# Patient Record
Sex: Female | Born: 1955 | Race: White | Hispanic: No | Marital: Single | Smoking: Current every day smoker
Health system: Southern US, Community
[De-identification: ages and names within clinical notes are randomized; demographics above are authoritative.]

## PROBLEM LIST (undated history)

## (undated) DIAGNOSIS — J302 Other seasonal allergic rhinitis: Secondary | ICD-10-CM

## (undated) DIAGNOSIS — J449 Chronic obstructive pulmonary disease, unspecified: Secondary | ICD-10-CM

## (undated) DIAGNOSIS — J45909 Unspecified asthma, uncomplicated: Secondary | ICD-10-CM

## (undated) DIAGNOSIS — E669 Obesity, unspecified: Secondary | ICD-10-CM

## (undated) DIAGNOSIS — E119 Type 2 diabetes mellitus without complications: Secondary | ICD-10-CM

## (undated) DIAGNOSIS — Z87891 Personal history of nicotine dependence: Secondary | ICD-10-CM

## (undated) HISTORY — PX: ABDOMINAL HYSTERECTOMY: SHX81

## (undated) HISTORY — DX: Personal history of nicotine dependence: Z87.891

## (undated) HISTORY — DX: Unspecified asthma, uncomplicated: J45.909

## (undated) HISTORY — PX: LAPAROSCOPIC CHOLECYSTECTOMY: SUR755

## (undated) HISTORY — DX: Type 2 diabetes mellitus without complications: E11.9

## (undated) HISTORY — DX: Obesity, unspecified: E66.9

## (undated) HISTORY — DX: Chronic obstructive pulmonary disease, unspecified: J44.9

## (undated) HISTORY — DX: Other seasonal allergic rhinitis: J30.2

---

## 1998-03-11 ENCOUNTER — Emergency Department (HOSPITAL_COMMUNITY): Admission: EM | Admit: 1998-03-11 | Discharge: 1998-03-11 | Payer: Self-pay | Admitting: Emergency Medicine

## 1998-09-03 ENCOUNTER — Emergency Department (HOSPITAL_COMMUNITY): Admission: EM | Admit: 1998-09-03 | Discharge: 1998-09-03 | Payer: Self-pay | Admitting: Emergency Medicine

## 1998-09-03 ENCOUNTER — Encounter: Payer: Self-pay | Admitting: Emergency Medicine

## 2000-05-27 ENCOUNTER — Emergency Department (HOSPITAL_COMMUNITY): Admission: EM | Admit: 2000-05-27 | Discharge: 2000-05-27 | Payer: Self-pay | Admitting: *Deleted

## 2001-04-10 ENCOUNTER — Emergency Department (HOSPITAL_COMMUNITY): Admission: EM | Admit: 2001-04-10 | Discharge: 2001-04-11 | Payer: Self-pay | Admitting: Emergency Medicine

## 2001-04-11 ENCOUNTER — Encounter: Payer: Self-pay | Admitting: Emergency Medicine

## 2001-04-17 ENCOUNTER — Ambulatory Visit (HOSPITAL_COMMUNITY): Admission: RE | Admit: 2001-04-17 | Discharge: 2001-04-17 | Payer: Self-pay | Admitting: Internal Medicine

## 2001-04-17 ENCOUNTER — Encounter: Payer: Self-pay | Admitting: Internal Medicine

## 2001-04-23 ENCOUNTER — Encounter: Payer: Self-pay | Admitting: Gastroenterology

## 2001-04-23 ENCOUNTER — Encounter (INDEPENDENT_AMBULATORY_CARE_PROVIDER_SITE_OTHER): Payer: Self-pay | Admitting: Specialist

## 2001-04-24 ENCOUNTER — Inpatient Hospital Stay (HOSPITAL_COMMUNITY): Admission: RE | Admit: 2001-04-24 | Discharge: 2001-04-25 | Payer: Self-pay | Admitting: Gastroenterology

## 2001-05-29 ENCOUNTER — Emergency Department (HOSPITAL_COMMUNITY): Admission: EM | Admit: 2001-05-29 | Discharge: 2001-05-29 | Payer: Self-pay | Admitting: Emergency Medicine

## 2001-08-13 ENCOUNTER — Emergency Department (HOSPITAL_COMMUNITY): Admission: EM | Admit: 2001-08-13 | Discharge: 2001-08-13 | Payer: Self-pay | Admitting: Emergency Medicine

## 2001-11-07 ENCOUNTER — Emergency Department (HOSPITAL_COMMUNITY): Admission: EM | Admit: 2001-11-07 | Discharge: 2001-11-07 | Payer: Self-pay | Admitting: Emergency Medicine

## 2002-06-30 ENCOUNTER — Emergency Department (HOSPITAL_COMMUNITY): Admission: EM | Admit: 2002-06-30 | Discharge: 2002-07-01 | Payer: Self-pay | Admitting: Emergency Medicine

## 2004-02-03 ENCOUNTER — Emergency Department (HOSPITAL_COMMUNITY): Admission: EM | Admit: 2004-02-03 | Discharge: 2004-02-03 | Payer: Self-pay | Admitting: Emergency Medicine

## 2004-02-03 ENCOUNTER — Emergency Department (HOSPITAL_COMMUNITY): Admission: EM | Admit: 2004-02-03 | Discharge: 2004-02-03 | Payer: Self-pay | Admitting: Family Medicine

## 2004-02-14 ENCOUNTER — Encounter: Admission: RE | Admit: 2004-02-14 | Discharge: 2004-02-14 | Payer: Self-pay | Admitting: Obstetrics and Gynecology

## 2004-02-23 ENCOUNTER — Encounter: Admission: RE | Admit: 2004-02-23 | Discharge: 2004-02-23 | Payer: Self-pay | Admitting: Obstetrics and Gynecology

## 2004-03-08 ENCOUNTER — Encounter: Admission: RE | Admit: 2004-03-08 | Discharge: 2004-03-08 | Payer: Self-pay | Admitting: Obstetrics and Gynecology

## 2004-03-27 ENCOUNTER — Encounter: Admission: RE | Admit: 2004-03-27 | Discharge: 2004-03-27 | Payer: Self-pay | Admitting: Obstetrics and Gynecology

## 2004-03-29 ENCOUNTER — Ambulatory Visit (HOSPITAL_COMMUNITY): Admission: RE | Admit: 2004-03-29 | Discharge: 2004-03-29 | Payer: Self-pay | Admitting: *Deleted

## 2004-04-03 ENCOUNTER — Encounter: Admission: RE | Admit: 2004-04-03 | Discharge: 2004-04-03 | Payer: Self-pay | Admitting: Obstetrics and Gynecology

## 2004-04-16 ENCOUNTER — Encounter (INDEPENDENT_AMBULATORY_CARE_PROVIDER_SITE_OTHER): Payer: Self-pay | Admitting: Specialist

## 2004-04-16 ENCOUNTER — Ambulatory Visit (HOSPITAL_COMMUNITY): Admission: RE | Admit: 2004-04-16 | Discharge: 2004-04-16 | Payer: Self-pay | Admitting: Obstetrics and Gynecology

## 2005-03-25 ENCOUNTER — Ambulatory Visit: Payer: Self-pay | Admitting: Family Medicine

## 2005-03-26 ENCOUNTER — Ambulatory Visit: Payer: Self-pay | Admitting: *Deleted

## 2005-04-10 ENCOUNTER — Ambulatory Visit: Payer: Self-pay | Admitting: Internal Medicine

## 2005-06-21 ENCOUNTER — Ambulatory Visit: Payer: Self-pay | Admitting: Family Medicine

## 2005-11-20 ENCOUNTER — Ambulatory Visit: Payer: Self-pay | Admitting: Family Medicine

## 2005-11-20 DIAGNOSIS — J309 Allergic rhinitis, unspecified: Secondary | ICD-10-CM

## 2005-12-04 ENCOUNTER — Ambulatory Visit: Payer: Self-pay | Admitting: Family Medicine

## 2005-12-04 DIAGNOSIS — M722 Plantar fascial fibromatosis: Secondary | ICD-10-CM

## 2006-04-14 ENCOUNTER — Ambulatory Visit: Payer: Self-pay | Admitting: Family Medicine

## 2006-05-05 ENCOUNTER — Ambulatory Visit: Payer: Self-pay | Admitting: Family Medicine

## 2006-07-26 ENCOUNTER — Emergency Department (HOSPITAL_COMMUNITY): Admission: EM | Admit: 2006-07-26 | Discharge: 2006-07-26 | Payer: Self-pay | Admitting: Emergency Medicine

## 2006-07-29 ENCOUNTER — Ambulatory Visit: Payer: Self-pay | Admitting: Family Medicine

## 2007-01-26 ENCOUNTER — Ambulatory Visit: Payer: Self-pay | Admitting: Family Medicine

## 2007-02-02 ENCOUNTER — Ambulatory Visit (HOSPITAL_BASED_OUTPATIENT_CLINIC_OR_DEPARTMENT_OTHER): Admission: RE | Admit: 2007-02-02 | Discharge: 2007-02-02 | Payer: Self-pay

## 2007-05-25 DIAGNOSIS — N924 Excessive bleeding in the premenopausal period: Secondary | ICD-10-CM

## 2007-05-25 DIAGNOSIS — F329 Major depressive disorder, single episode, unspecified: Secondary | ICD-10-CM

## 2007-05-25 DIAGNOSIS — J45909 Unspecified asthma, uncomplicated: Secondary | ICD-10-CM | POA: Insufficient documentation

## 2007-06-03 ENCOUNTER — Encounter (INDEPENDENT_AMBULATORY_CARE_PROVIDER_SITE_OTHER): Payer: Self-pay | Admitting: *Deleted

## 2007-07-02 ENCOUNTER — Emergency Department (HOSPITAL_COMMUNITY): Admission: EM | Admit: 2007-07-02 | Discharge: 2007-07-02 | Payer: Self-pay | Admitting: Emergency Medicine

## 2007-10-20 ENCOUNTER — Ambulatory Visit: Payer: Self-pay | Admitting: Family Medicine

## 2007-10-20 DIAGNOSIS — R51 Headache: Secondary | ICD-10-CM

## 2007-10-20 DIAGNOSIS — R519 Headache, unspecified: Secondary | ICD-10-CM | POA: Insufficient documentation

## 2007-10-20 DIAGNOSIS — A630 Anogenital (venereal) warts: Secondary | ICD-10-CM | POA: Insufficient documentation

## 2007-10-20 LAB — CONVERTED CEMR LAB
ALT: 15 units/L (ref 0–35)
AST: 12 units/L (ref 0–37)
Albumin: 4.5 g/dL (ref 3.5–5.2)
Calcium: 9.1 mg/dL (ref 8.4–10.5)
Cholesterol: 158 mg/dL (ref 0–200)
Eosinophils Relative: 3 % (ref 0–5)
HDL: 63 mg/dL (ref >39)
Lymphocytes Relative: 30 % (ref 12–46)
MCHC: 32.7 g/dL (ref 30.0–36.0)
MCV: 89.8 fL (ref 78.0–100.0)
Monocytes Absolute: 0.7 10*3/uL (ref 0.1–1.0)
Monocytes Relative: 7 % (ref 3–12)
Neutro Abs: 6.2 10*3/uL (ref 1.7–7.7)
Neutrophils Relative %: 61 % (ref 43–77)
Platelets: 411 10*3/uL — ABNORMAL HIGH (ref 150–400)
Potassium: 4.1 meq/L (ref 3.5–5.3)
RBC: 4.6 M/uL (ref 3.87–5.11)
TSH: 0.916 microintl units/mL (ref 0.350–5.50)
Triglycerides: 112 mg/dL (ref <150)
VLDL: 22 mg/dL (ref 0–40)

## 2007-11-08 ENCOUNTER — Encounter (INDEPENDENT_AMBULATORY_CARE_PROVIDER_SITE_OTHER): Payer: Self-pay | Admitting: Family Medicine

## 2007-12-16 ENCOUNTER — Ambulatory Visit: Payer: Self-pay | Admitting: Obstetrics and Gynecology

## 2007-12-24 ENCOUNTER — Ambulatory Visit: Payer: Self-pay | Admitting: *Deleted

## 2008-04-16 ENCOUNTER — Emergency Department (HOSPITAL_COMMUNITY): Admission: EM | Admit: 2008-04-16 | Discharge: 2008-04-16 | Payer: Self-pay | Admitting: Emergency Medicine

## 2008-06-17 ENCOUNTER — Encounter (INDEPENDENT_AMBULATORY_CARE_PROVIDER_SITE_OTHER): Payer: Self-pay | Admitting: Family Medicine

## 2008-08-05 ENCOUNTER — Emergency Department (HOSPITAL_COMMUNITY): Admission: EM | Admit: 2008-08-05 | Discharge: 2008-08-05 | Payer: Self-pay | Admitting: Emergency Medicine

## 2008-09-01 ENCOUNTER — Ambulatory Visit: Payer: Self-pay | Admitting: Nurse Practitioner

## 2008-09-01 DIAGNOSIS — B86 Scabies: Secondary | ICD-10-CM | POA: Insufficient documentation

## 2008-10-26 ENCOUNTER — Ambulatory Visit: Payer: Self-pay | Admitting: Family Medicine

## 2008-12-26 ENCOUNTER — Ambulatory Visit: Payer: Self-pay | Admitting: Family Medicine

## 2008-12-26 DIAGNOSIS — F17200 Nicotine dependence, unspecified, uncomplicated: Secondary | ICD-10-CM

## 2008-12-27 ENCOUNTER — Encounter (INDEPENDENT_AMBULATORY_CARE_PROVIDER_SITE_OTHER): Payer: Self-pay | Admitting: Family Medicine

## 2009-02-01 ENCOUNTER — Telehealth (INDEPENDENT_AMBULATORY_CARE_PROVIDER_SITE_OTHER): Payer: Self-pay | Admitting: Family Medicine

## 2009-06-08 ENCOUNTER — Emergency Department (HOSPITAL_COMMUNITY): Admission: EM | Admit: 2009-06-08 | Discharge: 2009-06-08 | Payer: Self-pay | Admitting: Family Medicine

## 2009-07-13 ENCOUNTER — Ambulatory Visit: Payer: Self-pay | Admitting: Physician Assistant

## 2009-07-13 DIAGNOSIS — M25519 Pain in unspecified shoulder: Secondary | ICD-10-CM

## 2009-07-14 ENCOUNTER — Encounter: Payer: Self-pay | Admitting: Physician Assistant

## 2009-07-22 ENCOUNTER — Encounter: Payer: Self-pay | Admitting: Physician Assistant

## 2009-08-03 ENCOUNTER — Telehealth: Payer: Self-pay | Admitting: Physician Assistant

## 2009-08-07 ENCOUNTER — Telehealth: Payer: Self-pay | Admitting: Physician Assistant

## 2009-08-27 ENCOUNTER — Encounter: Payer: Self-pay | Admitting: Physician Assistant

## 2009-09-13 ENCOUNTER — Encounter: Payer: Self-pay | Admitting: Physician Assistant

## 2009-09-21 ENCOUNTER — Encounter: Admission: RE | Admit: 2009-09-21 | Discharge: 2009-12-20 | Payer: Self-pay | Admitting: Orthopedic Surgery

## 2009-10-02 ENCOUNTER — Telehealth: Payer: Self-pay | Admitting: Physician Assistant

## 2009-10-06 ENCOUNTER — Telehealth: Payer: Self-pay | Admitting: Physician Assistant

## 2009-10-13 ENCOUNTER — Telehealth: Payer: Self-pay | Admitting: Physician Assistant

## 2009-11-01 ENCOUNTER — Telehealth: Payer: Self-pay | Admitting: Physician Assistant

## 2009-11-09 ENCOUNTER — Ambulatory Visit: Payer: Self-pay | Admitting: Physician Assistant

## 2009-12-06 ENCOUNTER — Ambulatory Visit: Payer: Self-pay | Admitting: Obstetrics and Gynecology

## 2009-12-21 ENCOUNTER — Encounter: Payer: Self-pay | Admitting: Physician Assistant

## 2010-07-02 ENCOUNTER — Encounter: Payer: Self-pay | Admitting: Physician Assistant

## 2010-10-07 ENCOUNTER — Encounter: Payer: Self-pay | Admitting: Family Medicine

## 2010-10-16 NOTE — Letter (Signed)
Summary: MAILED REQUESTED RECORDS TO [facility] SERVICES [facility]  MAILED REQUESTED RECORDS TO [facility]   Imported By: Arta Bruce 07/02/2010 11:50:27  _____________________________________________________________________  External Attachment:    Type:   Image     Comment:   External Document

## 2010-10-16 NOTE — Letter (Signed)
Summary: MAILED REQUESTED RECORDS TO [facility]  MAILED REQUESTED RECORDS TO [facility]   Imported By: Arta Bruce 12/21/2009 15:43:19  _____________________________________________________________________  External Attachment:    Type:   Image     Comment:   External Document

## 2010-10-16 NOTE — Letter (Signed)
Summary: REFERRAL/ORTHOPEDIC  REFERRAL/ORTHOPEDIC   Imported By: Arta Bruce 09/19/2009 14:28:05  _____________________________________________________________________  External Attachment:    Type:   Image     Comment:   External Document

## 2010-10-16 NOTE — Progress Notes (Signed)
Summary: SINGULAIR NEEDS PRIOR APPROVAL   Phone Note Call from Patient Call back at Home Phone [phone]   Reason for Call: Refill Medication Summary of Call: Alisha Roth PT. MS Sorto SAYS THAT SHE IS STILL WAITING ON HER SINGULAIR. [facility] AND THEY TOLD HER THAT IT NEEDS PRIOR APPROVAL AND WE NEED TO CALL THE PHARMACY. Initial call taken by: Leodis Rains,  November 01, 2009 9:20 AM  Follow-up for Phone Call        spoke with pharmacy and they said the med is accepted with insurance now..... inform pt and she is aware Follow-up by: Armenia Shannon,  November 01, 2009 12:59 PM

## 2010-10-16 NOTE — Assessment & Plan Note (Signed)
Summary: BOIL ON BUTTOCK/CHK ASTHMA////KT   Vital Signs:  Patient profile:   55 year old female Height:      60 inches Weight:      171 pounds BMI:     33.52 O2 Sat:      94 % on Room air Temp:     98.2 degrees F oral Pulse rate:   92 / minute Pulse rhythm:   regular Resp:     18 per minute BP sitting:   125 / 81  (left arm) Cuff size:   large  Vitals Entered By: Armenia Shannon (November 09, 2009 2:03 PM)  O2 Flow:  Room air CC: pt has a boil on her bottom and would like for you to look at  at.... pt wants her asthma checked and she would like a flu shot...  Does patient need assistance? Functional Status Self care Ambulation Normal Comments PF.   1.   130     2.  100     3.   140     Primary Care Provider:  Tereso Newcomer PA-C  CC:  pt has a boil on her bottom and would like for you to look at  at.... pt wants her asthma checked and she would like a flu shot....  History of Present Illness: Here for lesion on buttocks present about a month.  No pain .  No fever.  No chills.  No drainage.  Asthma/COPD:  Continues to smoke.  Symptoms overall stable.  She uses proventil more when she gets a cold.  Still using advair, singulair.  Depression:  Goes to [facility] once a month.  No suicidal ideations.  Mood seems stable.  Left shoulder:  Had RTC surgery on left in January 2011 with Dr. Madelon Lips.  Feeling much better.   Asthma History    Initial Asthma Severity Rating:    Age range: 12+ years    Symptoms: >2 days/week; not daily    Nighttime Awakenings: 3-4/month    Interferes w/ normal activity: no limitations    SABA use (not for EIB): daily    Asthma Severity Assessment: Moderate Persistent   Habits & Providers  Alcohol-Tobacco-Diet     Tobacco Status: current     Tobacco Counseling: to quit use of tobacco products     Cigarette Packs/Day: 0.5  Current Medications (verified): 1)  Advair Diskus 250-50 Mcg/dose  Misc (Fluticasone-Salmeterol) .... One Inhalation Two Times A  Day  (Pharmacy Note That Prior Auth Has Been Received X 2 Years From Today; Please Notify Patient When Rx Ready) 2)  Albuterol Sulfate (2.5 Mg/51ml) 0.083%  Nebu (Albuterol Sulfate) .... Use One Ampule Per Svn Every 4-6 Hours As Needed 3)  Ventolin Hfa 108 (90 Base) Mcg/act  Aers (Albuterol Sulfate) .... 2 Puffs Every Four Hours As Needed For Shortness of Breath 4)  Elimite 5 % Crea (Permethrin) .... Massage Cream Into Entire Body.  Let Sit For 8-12 Hours Then Rinse 5)  Triamcinolone Acetonide 0.1 % Oint (Triamcinolone Acetonide) .... Apply To Affected Area Two Times A Day As Needed For Itching 6)  Clonazepam 1 Mg Tabs (Clonazepam) .... Take 1/2 To 1 Tablet By Mouth Every 12 Hours 7)  Effexor Xr 150 Mg Xr24h-Cap (Venlafaxine Hcl) .... Take 1 Tablet By Mouth Once A Day 8)  Prednisone 20 Mg Tabs (Prednisone) .... Take 2 Tablets By Mouth  Every Morning X 5 Days 9)  Singulair 10 Mg Tabs (Montelukast Sodium) .Marland Kitchen.. 1 By Mouth Daily For Asthma 10)  Ibuprofen 800 Mg Tabs (Ibuprofen) .... Take 1 Tablet By Mouth Three Times A Day As Needed For Pain  Allergies (verified): No Known Drug Allergies  Social History: Packs/Day:  0.5  Physical Exam  General:  alert, well-developed, and well-nourished.   Head:  normocephalic and atraumatic.   Lungs:  normal breath sounds, no crackles, and no wheezes.   Heart:  normal rate and regular rhythm.   Rectal:  1 cm lesion with scalloping somewhat c/w condyloma at lower buttocks post to vaginal area Neurologic:  alert & oriented X3 and cranial nerves II-XII intact.   Psych:  normally interactive.     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 Use one ampule per svn every 4-6 hours as needed    Prednisone 20 Mg Tabs (Prednisone) .Marland Kitchen... Take 2 tablets by mouth  every morning x 5 days Her updated medication list for this problem includes:    Advair Diskus 250-50 Mcg/dose Misc (Fluticasone-salmeterol) ..... One inhalation two times a day  (pharmacy note that prior Berkley Harvey has been received x 2 years from today; please notify patient when rx ready)    Ventolin Hfa 108 (90 Base) Mcg/act Aers (Albuterol sulfate) .Marland Kitchen... 2 puffs every four hours as needed for shortness of breath    Singulair 10 Mg Tabs (Montelukast sodium) .Marland Kitchen... 1 by mouth daily for asthma  Problem # 3:  DEPRESSION (ICD-311) follows at the [facility]  The following medications were removed from the medication list:    Clonazepam 1 Mg Tabs (Clonazepam) .Marland Kitchen... Take 1/2 to 1 tablet by mouth every 12 hours    Effexor Xr 150 Mg Xr24h-cap (Venlafaxine hcl) .Marland Kitchen... Take 1 tablet by mouth once a day Her updated medication list for this problem includes:    Sertraline Hcl 100 Mg Tabs (Sertraline hcl) .Marland Kitchen... 2 by mouth in the morning  Problem # 4:  Preventive Health Care (ICD-V70.0) no pap or mammo in long time will schedule  Complete Medication List: 1)  Advair Diskus 250-50 Mcg/dose Misc (Fluticasone-salmeterol) .... One inhalation two times a day  (pharmacy note that prior Berkley Harvey has been received x 2 years from today; please notify patient when rx ready) 2)  Ventolin Hfa 108 (90 Base) Mcg/act Aers (Albuterol sulfate) .... 2 puffs every four hours as needed for shortness of breath 3)  Singulair 10 Mg Tabs (Montelukast sodium) .Marland Kitchen.. 1 by mouth daily for asthma 4)  Ibuprofen 800 Mg Tabs (Ibuprofen) .... Take 1 tablet by mouth three times a day as needed for pain 5)  Sertraline Hcl 100 Mg Tabs (Sertraline hcl) .... 2 by mouth in the morning 6)  Seroquel  100 Mg Tabs (Quetiapine fumarate) .... Take 1 tab by mouth at bedtime ([facility]) 7)  Oxycodone-acetaminophen 5-325 Mg Tabs (Oxycodone-acetaminophen) .Marland Kitchen.. 1 by mouth every 6-8 hours as needed for pain  Other Orders: Flu Vaccine 51yrs + [phone]) Admin 1st Vaccine (40347) Admin 1st Vaccine [facility]) [phone]) Pneumococcal Vaccine (38756) Admin of Any Addtl Vaccine (43329) Admin of Any Addtl Vaccine (State) [phone])  Patient Instructions: 1)  Please schedule a follow-up appointment in 6 weeks with Jashira Cotugno for CPP. 2)      Pneumovax Vaccine    Vaccine Type: Pneumovax    Site: left deltoid    Mfr: Merck    Dose: 0.5 ml    Route: IM    Given by: Armenia Shannon    Exp. Date: 10/13/2010    Lot #: [id number]    VIS given: 04/13/96 version given November 09, 2009.  Influenza Vaccine    Vaccine Type: Fluvax 3+    Site: right deltoid    Mfr: Sanofi Pasteur    Dose: 0.5 ml    Route: IM    Given by: Armenia Shannon    Exp. Date: 03/15/2010    Lot #: [id number]    VIS given: 04/09/07 version given November 09, 2009.  Flu Vaccine Consent Questions    Do you have a history of severe allergic reactions to this vaccine? no    Any prior history of allergic reactions to egg and/or gelatin? no    Do you have a sensitivity to the preservative Thimersol? no    Do you have a past history of Guillan-Barre Syndrome? no    Do you currently have an acute febrile illness? no    Have you ever had a severe reaction to latex? no    Vaccine information given and explained to patient? yes    Are you currently pregnant? no

## 2010-10-16 NOTE — Progress Notes (Signed)
Summary: Refills Request   Phone Note Call from Patient Call back at Home Phone [phone]   Summary of Call: [facility] [phone]) but they suggested her to call here and she needs more refills from [facility].  If is possible, can the provider send the refill request before 1 pm because she has transportation issues if so  can the clinic call directly to her daughter Olene Craven [phone]. [place]  Initial call taken by: Manon Hilding,  October 13, 2009 8:55 AM  Follow-up for Phone Call        last rx 04/2009 and last visit 10/10-will forward to provider for review Follow-up by: Mikey College CMA,  October 13, 2009 3:40 PM  Additional Follow-up for Phone Call Additional follow up Details #1::        pt is aware med is at pharmacy  Additional Follow-up by: Armenia Shannon,  October 17, 2009 12:10 PM    Prescriptions: SINGULAIR 10 MG TABS (MONTELUKAST SODIUM) 1 by mouth daily for asthma  #30 x 5   Entered and Authorized by:   Tereso Newcomer PA-C   Signed by:   Tereso Newcomer PA-C on 10/16/2009   Method used:   [facility]  [address] [id number]       [address]       [place], [place]  [address]       Ph: [phone]       Fax: [phone]   RxID:   [id number]

## 2010-10-16 NOTE — Letter (Signed)
Summary: [facility]   Imported By: Arta Bruce 10/30/2009 15:33:11  _____________________________________________________________________  External Attachment:    Type:   Image     Comment:   External Document

## 2010-10-16 NOTE — Progress Notes (Signed)
   Phone Note Call from Patient Call back at [facility] Phone [phone]   Summary of Call: A week ago the pt had a shoulder surgery and she needs refills from  two her regular medications advair and ventollin.  ([facility] [phone]) [place] Initial call taken by: Manon Hilding,  October 02, 2009 2:28 PM  Follow-up for Phone Call        forward to provider Follow-up by: Armenia Shannon,  October 02, 2009 2:56 PM    Prescriptions: ADVAIR DISKUS 250-50 MCG/DOSE  MISC (FLUTICASONE-SALMETEROL) one inhalation two times a day  Rinse and spit after use  #1 x 5   Entered and Authorized by:   Tereso Newcomer PA-C   Signed by:   Tereso Newcomer PA-C on 10/02/2009   Method used:   [facility]        [facility]  [address] [id number]       [address]       [place], [place]  [address]       Ph: [phone]       Fax: [phone]   RxID:   [id number] VENTOLIN HFA 108 (90 BASE) MCG/ACT  AERS (ALBUTEROL SULFATE) 2 puffs every four hours as needed for shortness of breath  #1 x 5   Entered and Authorized by:   Tereso Newcomer PA-C   Signed by:   Tereso Newcomer PA-C on 10/02/2009   Method used:   [facility]        [facility]  [address] [id number]       [address]       [place], [place]  [address]       Ph: [phone]       Fax: [phone]   RxID:   [id number]

## 2010-10-16 NOTE — Progress Notes (Signed)
Summary: ADVAIR NEEDS PRIOR APPROVAL  Medications Added ADVAIR DISKUS 250-50 MCG/DOSE  MISC (FLUTICASONE-SALMETEROL) one inhalation two times a day  (pharmacy note that prior Berkley Harvey has been received x 2 years from today; please notify patient when Rx ready)       Phone Note Call from Patient Call back at [facility] Phone [phone]   Summary of Call: Francisco Eyerly PT. MS Bellin CALLED AN SAYS THAT SHE COULDN'T GET HER ADVAIR INHALER BECAUSE WE NEED TO CALL MEDICAID FOR PRIOR APPROVAL. [facility] [address] Initial call taken by: Leodis Rains,  October 06, 2009 10:14 AM  Follow-up for Phone Call        forward to provider Follow-up by: Armenia Shannon,  October 06, 2009 3:36 PM    New/Updated Medications: ADVAIR DISKUS 250-50 MCG/DOSE  MISC (FLUTICASONE-SALMETEROL) one inhalation two times a day  (pharmacy note that prior Berkley Harvey has been received x 2 years from today; please notify patient when Rx ready) Prescriptions: ADVAIR DISKUS 250-50 MCG/DOSE  MISC (FLUTICASONE-SALMETEROL) one inhalation two times a day  (pharmacy note that prior Berkley Harvey has been received x 2 years from today; please notify patient when Rx ready)  #1 x 11   Entered and Authorized by:   Tereso Newcomer PA-C   Signed by:   Tereso Newcomer PA-C on 10/06/2009   Method used:   [facility]        [facility]  [address] [id number]       [address]       [place], [place]  [address]       Ph: [phone]       Fax: [phone]   RxID:   [id number]

## 2010-10-16 NOTE — Letter (Signed)
Summary: REFERRAL//ORTHOPEDIC//APPT DATE & TIME  REFERRAL//ORTHOPEDIC//APPT DATE & TIME   Imported By: Arta Bruce 10/02/2009 16:34:59  _____________________________________________________________________  External Attachment:    Type:   Image     Comment:   External Document

## 2011-02-01 NOTE — Op Note (Signed)
Alisha Roth, Alisha Roth                         ACCOUNT NO.:  [id number]   MEDICAL RECORD NO.:  [id number]                   PATIENT TYPE:  AMB   LOCATION:  SDC                                  FACILITY:  [facility]   PHYSICIAN:  Phil D. Okey Dupre, M.D.                  DATE OF BIRTH:  10/08/1955   DATE OF PROCEDURE:  04/16/2004  DATE OF DISCHARGE:                                 OPERATIVE REPORT   PREOPERATIVE DIAGNOSIS:  Persistent condyloma acuminata of vulvar, perineal  and anal areas.   POSTOPERATIVE DIAGNOSIS:  Persistent condyloma acuminata of vulvar, perineal  and anal areas.   OPERATION PERFORMED:  Excision and fulguration of condyloma acuminata.   SURGEON:  Javier Glazier. Okey Dupre, M.D.   ANESTHESIA:  General.   P.O. CONDITION:  Satisfactory.   SPECIMENS:  Various condylomata acuminata.   OPERATIVE FINDINGS:  On examining the patient's perineum, the lower portion  of the vulva, the perineum and the perianal and anal area had multiple small  condylomata acuminata that obviously did not resolve with conservative  treatment.   DESCRIPTION OF PROCEDURE:  The ball cautery set at 25 coag and a loop  electrode set at 25 blend was used to remove or fulgurate the condylomata.  This was done and in the nonhairy areas, the fulguration was taken down to  the first plane in the hairy areas, down to the second.  One area where  there was a slightly enlarged condylomata we had to remove, a 3-0 plain  suture was used to approximate the skin edges after removal.  That was on  the right side of the perineum in an area that measured 2 x 1 cm.  Otherwise, no suturing was done.  Prior to fulguration, majority of the  condylomata with any substance, were raised with a 1% Xylocaine 1:200,000  epinephrine over the first subepidermal area with a 24 needle.  Silver  Sulfadine was then placed over the entire surgical area and the patient  discharged to be seen in the office in one week.                          Phil D. Okey Dupre, M.D.    PDR/MEDQ  D:  04/16/2004  T:  04/16/2004  Job:  [id number]

## 2011-02-01 NOTE — Op Note (Signed)
Alisha Roth, Alisha Roth             ACCOUNT NO.:  [id number]   MEDICAL RECORD NO.:  [id number]          PATIENT TYPE:  AMB   LOCATION:  DSC                          FACILITY:  [facility]   PHYSICIAN:  Alvan Dame, D.P.M. DATE OF BIRTH:  1956/04/07   DATE OF PROCEDURE:  02/02/2007  DATE OF DISCHARGE:                               OPERATIVE REPORT   PREOPERATIVE DIAGNOSIS:  Plantar fasciitis, right foot.   POSTOPERATIVE DIAGNOSIS:  Plantar fasciitis, right foot.   OPERATIVE PROCEDURE:  Endoscopic plantar fasciotomy (EPF), right foot.   SURGEON:  Alvan Dame, DPM.   ANESTHESIA:  MAC and local anesthetic administered, a total of 20 mL,  50:50 mixture of 2% Xylocaine and 0.5% Marcaine plain in a regional and  ankle block fashion.   HEMOSTASIS:  Use of right ankle tourniquet at 250 mmHg.   INDICATIONS FOR SURGERY:  The patient has had a greater than 4-month  history of pain nonresponsive to conservative care, including anti-  inflammatories, injections and mobilization, orthoses, change of shoe  gear, stretching, ice and change of activities.  The patient at this  time continues to have pain, and based upon her clinical symptomatology  and failure to respond to conservative care, surgery is proposed.  No  contraindications noted.   FINDINGS AND PROCEDURE:  The patient was brought into the OR and placed  on the table in a supine position.  IV sedation was established.  Local  anesthetic was administered, and the foot was prepped and draped in the  usual aseptic manner.  The foot was exsanguinated with an Esmarch wrap,  and the ankle tourniquet inflated to 250 mmHg.  The following procedure  was then carried out.   EPF, right foot:  Attention was directed to the medial aspect of the  right heel, where approximately a 1-cm stab incision was made in a  vertical fashion approximately 5 cm from posterior to 2 cm from the  inferior surface of the heel.  This corresponded to be adjacent  to the  medial band of the plantar fascia.  A stab incision was made.  A Freer  elevator was introduced and a plane was made beneath the inferior  surface of the plantar fascia.  The Glorious Peach was removed and replaced with  a trocar and cannula, which were passed from medial to lateral beneath  the surface of the fascia, with the slot in the cannula up adjacent to  the surface of the inferior plantar fascia.  A small stab incision was  made on the lateral surface of the heel along the exit of the trocar and  cannula.  The trocar was removed, the cannula left in place.  At this  time, the camera was introduced medially and a Glorious Peach was introduced  laterally to identify the inferior surface of the fascia, which was  noted to be intact.  At this time, utilizing a triangle blade, the  medial 1/2 of the plantar fascia was transected along the gap.  Once  opened, the gap opened at least 4 mm, or the width of the slot in the  cannula.  Beneath  the fascia, the pink muscle belly was identified, and  noting an adequate resection or opening of the plantar fascia medial  band, at this time, the camera was removed from the medial opening of  the cannula and placed from lateral to inspect the most medial aspects  of the fascia.  The triangle blade was introduced and some additional  bands medially were released.  The cannula was then turned inferiorly to  inspect and no additional bands of the fascia were noted to be inferior  to the cannula.  At this time, the camera and instruments were removed.  The site was lavaged with copious amounts of sterile antibiotic solution  and cleared of all soft tissue debris.  Closure of each of the stab  sites was made utilizing a single 4-0 nylon suture.  The site was  infiltrated with 1 mL of dexamethasone phosphate, or 10 mg of  dexamethasone phosphate.  A Betadine saline-soaked sponge and a dry  sterile compressive dressing were applied to the right foot in the heel   and ankle area.  The ankle tourniquet was deflated with immediate return  of perfusion.   The patient was returned from the OR to recovery in satisfactory  condition, and discharged following postoperative instructions,  prescriptions for pain and antibiotic medication, and an appointment for  followup office visit having been made.           ______________________________  Alvan Dame, D.P.M.     RS/MEDQ  D:  02/03/2007  T:  02/03/2007  Job:  [id number]

## 2011-02-01 NOTE — Op Note (Signed)
[facility]  Patient:    Alisha Roth, Alisha Roth                         MRN: [id number] Proc. Date: 04/24/01 Adm. Date:  [id number] Attending:  Tempie Donning CC:         Barbette Hair. Arlyce Dice, M.D. [facility]  Corwin Levins, M.D. [facility]   Operative Report  OPERATIVE PROCEDURE:  Laparoscopic cholecystectomy.  SURGEON:  Gita Kudo, M.D.  ASSISTANTRiley Lam A. Magnus Ivan, M.D.  ANESTHESIA:  General endotracheal.  PREOPERATIVE DIAGNOSIS:  Cholecystitis.  POSTOPERATIVE DIAGNOSIS:  Cholecystitis.  CLINICAL SUMMARY:  A 55 year old female with bouts of abdominal pain and gallbladder ultrasound showing stones.  Her biliary ductal system was dilated, and she underwent ERCP by Dr. Arlyce Dice on August 8.  There was no evidence of any biliary obstruction.  Her liver function studies and amylase were within normal limits.  OPERATIVE FINDINGS:  Her gallbladder was thin-walled and had some filmy adhesions around it.  The common duct looked quite large.  I did not repeat any contrast studies since yesterdays films were of good quality.  OPERATIVE PROCEDURE:  Under satisfactory general endotracheal anesthesia, having received 1.0 gram Ancef preop, the patients abdomen was prepped and draped in a standard fashion.  A transverse incision made above the umbilicus and the midline opened into the peritoneum.  This was controlled with a figure-of-eight 0 Vicryl suture.  Hasson port inserted and secured.  CO2 pneumoperitoneum established and camera placed.  Under direct vision, through Marcaine-infiltrated skin incisions, two #5 ports placed laterally and a second #10 port medially.  Operating through the medial port and with graspers in the lateral port giving excellent exposure, I took the  adhesions down from the gallbladder with moderate use of cautery to avoid any burning.  Then I carefully identified the cystic duct gallbladder junction.  On ERCP, the cystic duct looked quite  long.  Because the common duct was so large and closed, I stayed close to the gallbladder.  I circumferentially dissected the cystic duct and when we were certain of the anatomy, it was controlled with multiple metal clips and divided between the distal two.  Likewise, the cystic artery was dissected and divided.  Then, the gallbladder was removed from below upward using the coagulating spatula for both hemostasis and dissection. Just before amputating, the liver bed was checked for hemostasis, lavaged with saline, cauterized, and then suctioned dry.  The gallbladder was then amputated and the camera moved to the upper port.  Through the umbilical port, a large grasper placed and used to extract the gallbladder intact.  Because of the large size of the stones, the gallbladder was opened externally and the stones removed - at least two large ones and possibly others left in.  At that point, the gallbladder came through the opening easily.  Then, the operative site was again inspected, lavaged, and suctioned dry.  The ports were removed under vision and CO2 released.  The midline closed with a previous figure-of-eight as well as a second interrupted 0 Vicryl suture.  The subcutaneous tissues were then closed with 4-0 Vicryl and Steri-Strips used to approximate the skin.  Dressings were applied, and the patient went to the recovery room from the operating room in good condition without complications. DD:  04/24/01 TD:  04/25/01 Job: [id number]

## 2015-08-25 ENCOUNTER — Encounter: Payer: Self-pay | Admitting: Gastroenterology

## 2018-03-27 LAB — PULMONARY FUNCTION TEST

## 2018-05-29 LAB — PULMONARY FUNCTION TEST

## 2018-08-31 ENCOUNTER — Ambulatory Visit (INDEPENDENT_AMBULATORY_CARE_PROVIDER_SITE_OTHER): Payer: Medicare Other | Admitting: Pulmonary Disease

## 2018-08-31 ENCOUNTER — Encounter: Payer: Self-pay | Admitting: Pulmonary Disease

## 2018-08-31 VITALS — BP 100/62 | HR 107 | Ht 61.0 in | Wt 209.6 lb

## 2018-08-31 DIAGNOSIS — G4733 Obstructive sleep apnea (adult) (pediatric): Secondary | ICD-10-CM

## 2018-08-31 DIAGNOSIS — Z9989 Dependence on other enabling machines and devices: Secondary | ICD-10-CM

## 2018-08-31 DIAGNOSIS — J449 Chronic obstructive pulmonary disease, unspecified: Secondary | ICD-10-CM

## 2018-08-31 NOTE — Patient Instructions (Addendum)
Thank you for visiting Dr. Tonia BroomsIcard at [facility]. Today we recommend the following:  Orders Placed This Encounter  Procedures  . Pulmonary function test   Please send records from prior pulmonologist. Please send records from PCP  Continue current inhaler regimen, Symbicort, Spiriva and albuterol as needed  Continue azithromycin MWF as directed from prior pulmonologist in [place]   Return in about 8 weeks (around 10/26/2018). Follow up with NP to review PFTs.

## 2018-08-31 NOTE — Progress Notes (Signed)
Synopsis: Self-referral in December 2019 for history of COPD asthma.  Subjective:   PATIENT ID: Alisha Roth GENDER: female DOB: [birth year], MRN: [id number]  Chief Complaint  Patient presents with  . Consult    States she is here Roth to SOB with exertion. Denies chest pain but chest remains tight all the time. States she has a intermittent cough. Currently on Amoxicillin for Bronchitis.     PMH of Asthma diagnosed as child, Former smoker, quit 2016, teenager to 2016, 50 + years, 0.5ppd unless stressed smoked she smoked more. Never had lung cancer screening imagining. Had PFTs within this past year. No current respiratory symptoms. She gets around ok on her own. Walking she sometimes gets out of breath. She was told she had COPD but not sure how bad.  Patient does describe a history of a bad exacerbation this past year.  She was seen by her prior pulmonologist who placed her on azithromycin Monday Wednesday and Friday.  Unfortunately, the patient has zero recollection of any of her prior medical history.  For example she her documentation here at [facility] suggest that she had a laparoscopic cholecystectomy and she has no memory of ever this happening. And most of this has to be probed out of her with questioning.  She is very confused on most of her medications but what I believe she is on currently is Symbicort, Spiriva and albuterol nebulizer as needed for shortness of breath and wheezing.  She also is currently taking an antibiotic Monday Wednesday and Friday which I presume is azithromycin.  She brought none of her medications to the office today and she brought no prior medical documentation either.  Overall we will need to request documentation from her prior pulmonologist as well as her current PCP.  For her current respiratory symptoms.  She does not have any except for a daily cough.  Is nonproductive, no hemoptysis. No wheezing.   Past Medical History:  Diagnosis Date  . Asthma   . COPD  (chronic obstructive pulmonary disease) (HCC)   . DM (diabetes mellitus) (HCC)   . Former smoker Quit 2016  . Obesity      Family History  Problem Relation Age of Onset  . Diabetes Mother   . Alcohol abuse Father   . Diabetes Sister   . Alcohol abuse Brother      Past Surgical History:  Procedure Laterality Date  . LAPAROSCOPIC CHOLECYSTECTOMY      Social History   Socioeconomic History  . Marital status: Single    Spouse name: Not on file  . Number of children: Not on file  . Years of education: Not on file  . Highest education level: Not on file  Occupational History  . Not on file  Social Needs  . Financial resource strain: Not on file  . Food insecurity:    Worry: Not on file    Inability: Not on file  . Transportation needs:    Medical: Not on file    Non-medical: Not on file  Tobacco Use  . Smoking status: Former Games developer  . Smokeless tobacco: Never Used  . Tobacco comment: quit 2016  Substance and Sexual Activity  . Alcohol use: Never    Frequency: Never  . Drug use: Never  . Sexual activity: Not on file  Lifestyle  . Physical activity:    Days per week: Not on file    Minutes per session: Not on file  . Stress: Not on file  Relationships  .  Social connections:    Talks on phone: Not on file    Gets together: Not on file    Attends religious service: Not on file    Active member of club or organization: Not on file    Attends meetings of clubs or organizations: Not on file    Relationship status: Not on file  . Intimate partner violence:    Fear of current or ex partner: Not on file    Emotionally abused: Not on file    Physically abused: Not on file    Forced sexual activity: Not on file  Other Topics Concern  . Not on file  Social History Narrative  . Not on file     Not on File   Outpatient Medications Prior to Visit  Medication Sig Dispense Refill  . albuterol (PROVENTIL) (2.5 MG/3ML) 0.083% nebulizer solution Inhale into the lungs.     . budesonide-formoterol (SYMBICORT) 160-4.5 MCG/ACT inhaler Inhale into the lungs.    . cetirizine (ZYRTEC) 10 MG tablet Take by mouth.    . dapagliflozin propanediol (FARXIGA) 10 MG TABS tablet Take by mouth.    . Insulin Degludec (TRESIBA) 100 UNIT/ML SOLN Inject 70 Units into the skin at bedtime.    . liraglutide (VICTOZA) 18 MG/3ML SOPN Inject into the skin.    Marland Kitchen lisinopril (PRINIVIL,ZESTRIL) 10 MG tablet Take by mouth.    . meloxicam (MOBIC) 15 MG tablet Take by mouth.    . montelukast (SINGULAIR) 10 MG tablet Take by mouth.    Marland Kitchen omeprazole (PRILOSEC) 40 MG capsule Take by mouth.    . QUEtiapine (SEROQUEL XR) 300 MG 24 hr tablet Take 600 mg by mouth at bedtime.      No facility-administered medications prior to visit.     This medication as listed above is likely inaccurate.  I have no medication list to go by.  And the patient is completely unreliable based upon her history taking.  Review of Systems  Constitutional: Negative for chills, fever, malaise/fatigue and weight loss.  HENT: Negative for hearing loss, sore throat and tinnitus.   Eyes: Negative for blurred vision and double vision.  Respiratory: Positive for cough. Negative for hemoptysis, sputum production, shortness of breath, wheezing and stridor.   Cardiovascular: Negative for chest pain, palpitations, orthopnea, leg swelling and PND.  Gastrointestinal: Negative for abdominal pain, constipation, diarrhea, heartburn, nausea and vomiting.  Genitourinary: Negative for dysuria, hematuria and urgency.  Musculoskeletal: Negative for joint pain and myalgias.  Skin: Negative for itching and rash.  Neurological: Negative for dizziness, tingling, weakness and headaches.  Endo/Heme/Allergies: Negative for environmental allergies. Does not bruise/bleed easily.  Psychiatric/Behavioral: Negative for depression. The patient is not nervous/anxious and does not have insomnia.   All other systems reviewed and are  negative.    Objective:  Physical Exam Vitals signs reviewed.  Constitutional:      General: She is not in acute distress.    Appearance: She is well-developed.  HENT:     Head: Normocephalic and atraumatic.  Eyes:     General: No scleral icterus.    Conjunctiva/sclera: Conjunctivae normal.     Pupils: Pupils are equal, round, and reactive to light.  Neck:     Musculoskeletal: Neck supple.     Vascular: No JVD.     Trachea: No tracheal deviation.  Cardiovascular:     Rate and Rhythm: Normal rate and regular rhythm.     Heart sounds: Normal heart sounds. No murmur.  Pulmonary:  Effort: Pulmonary effort is normal. No tachypnea, accessory muscle usage or respiratory distress.     Breath sounds: Normal breath sounds. No stridor. No wheezing, rhonchi or rales.  Abdominal:     General: Bowel sounds are normal. There is no distension.     Palpations: Abdomen is soft.     Tenderness: There is no abdominal tenderness.     Comments: Obese pannus Small horizontal well-healed scar/incision.  Does have history documented of prior laparoscopic cholecystectomy.  Musculoskeletal:        General: No tenderness.  Lymphadenopathy:     Cervical: No cervical adenopathy.  Skin:    General: Skin is warm and dry.     Capillary Refill: Capillary refill takes less than 2 seconds.     Findings: No rash.  Neurological:     Mental Status: She is alert and oriented to person, place, and time.     Comments: I suspect that she has baseline memory loss. Multiple times being asked the same question with varying responses.  Psychiatric:        Behavior: Behavior normal.      Vitals:   08/31/18 1116  BP: 100/62  Pulse: (!) 107  SpO2: 95%  Weight: 209 lb 9.6 oz (95.1 kg)  Height: 5\' 1"  (1.549 m)   95% on RA BMI Readings from Last 3 Encounters:  08/31/18 39.60 kg/m   Wt Readings from Last 3 Encounters:  08/31/18 209 lb 9.6 oz (95.1 kg)     CBC    Component Value Date/Time   WBC  10.3 10/20/2007 2137   RBC 4.60 10/20/2007 2137   HGB 13.5 10/20/2007 2137   HCT 41.3 10/20/2007 2137   PLT 411 (H) 10/20/2007 2137   MCV 89.8 10/20/2007 2137   MCHC 32.7 10/20/2007 2137   RDW 14.0 10/20/2007 2137   LYMPHSABS 3.1 10/20/2007 2137   MONOABS 0.7 10/20/2007 2137   EOSABS 0.3 10/20/2007 2137   BASOSABS 0.0 10/20/2007 2137    Chest Imaging: No prior chest imaging for review  Pulmonary Functions Testing Results: No prior documented PFTs  FeNO: None   Pathology: None   Echocardiogram: None   Heart Catheterization: None     Assessment & Plan:   Chronic obstructive pulmonary disease, unspecified COPD type (HCC) - Plan: Pulmonary function test  OSA on CPAP  Discussion: This is a 62 year old female that has a very little insight to her medical history.  I have no accurate medication list today.  She brought no prior documentation to the office.  She did not bring any of her medications with her.  By history taking alone we were able to glean that she takes 2 inhalers a day as well as an as needed albuterol nebulizer.  These medications seem to be Symbicort and Spiriva.  She was diagnosed with COPD per her prior pulmonologist in [place].  She was placed on azithromycin Monday Wednesday Friday.  This was also gleaned from history and assumed that we are taking azithromycin.  She initially told me she was taking amoxicillin 3 times a week however I doubt this to be true.  I have no medication bottles to confirm any of this.  She also states she has a history of asthma since childhood.  She is a former smoker that states she quit in 2016.  We will recommend the following plans:  First of all we need to obtain medical history that is accurate. I am not sure that all of the information that she  was able to give me today is completely accurate.  When asking the same question in various ways that she would give me multiple different responses.  She has no family with her today.   And she states that her family and here in [place] just does not understand her medical problems.  She has a sister back in [place] that she would prefer someone to talk to.  I recommended sending documentation from her prior pulmonologist in [place] as well as her PCP here to our office.  I think that she needs PFTs.  We will get these upon her next office visit. Follow-up in clinic in 8 weeks with one of our NPs to review PFTs Continue current inhaler regimen which I presume to be Symbicort, Spiriva, as needed albuterol for shortness of breath and wheezing as well as her azithromycin Monday Wednesday Friday.  To the 50% of this patient's 60-minute office visit was spent face-to-face discussing the above recommendations and treatment plan.    Current Outpatient Medications:  .  albuterol (PROVENTIL) (2.5 MG/3ML) 0.083% nebulizer solution, Inhale into the lungs., Disp: , Rfl:  .  budesonide-formoterol (SYMBICORT) 160-4.5 MCG/ACT inhaler, Inhale into the lungs., Disp: , Rfl:  .  cetirizine (ZYRTEC) 10 MG tablet, Take by mouth., Disp: , Rfl:  .  dapagliflozin propanediol (FARXIGA) 10 MG TABS tablet, Take by mouth., Disp: , Rfl:  .  Insulin Degludec (TRESIBA) 100 UNIT/ML SOLN, Inject 70 Units into the skin at bedtime., Disp: , Rfl:  .  liraglutide (VICTOZA) 18 MG/3ML SOPN, Inject into the skin., Disp: , Rfl:  .  lisinopril (PRINIVIL,ZESTRIL) 10 MG tablet, Take by mouth., Disp: , Rfl:  .  meloxicam (MOBIC) 15 MG tablet, Take by mouth., Disp: , Rfl:  .  montelukast (SINGULAIR) 10 MG tablet, Take by mouth., Disp: , Rfl:  .  omeprazole (PRILOSEC) 40 MG capsule, Take by mouth., Disp: , Rfl:  .  QUEtiapine (SEROQUEL XR) 300 MG 24 hr tablet, Take 600 mg by mouth at bedtime. , Disp: , Rfl:   I am unsure of the accuracy of this medication list as she is not sure of her daily medication regimen.  And she did not bring a list or bottles with her.  Josephine Igo, DO [facility] 08/31/2018 12:28 PM

## 2018-09-14 ENCOUNTER — Ambulatory Visit: Payer: Medicare Other | Admitting: Family Medicine

## 2018-09-22 ENCOUNTER — Encounter: Payer: Self-pay | Admitting: Family Medicine

## 2018-09-23 ENCOUNTER — Ambulatory Visit: Payer: Medicare Other | Admitting: Family Medicine

## 2018-10-01 ENCOUNTER — Telehealth: Payer: Self-pay | Admitting: Pulmonary Disease

## 2018-10-01 NOTE — Telephone Encounter (Signed)
PCCM:  I reviewed CXR brought to office. No report with it. Completed in July 2019. Image with evidence of COPD and emphysema.   Alisha Igo, DO [facility] 10/01/2018 4:44 PM

## 2018-10-02 ENCOUNTER — Other Ambulatory Visit: Payer: Self-pay

## 2018-10-05 ENCOUNTER — Ambulatory Visit (INDEPENDENT_AMBULATORY_CARE_PROVIDER_SITE_OTHER): Payer: Medicare Other | Admitting: Family Medicine

## 2018-10-05 ENCOUNTER — Encounter: Payer: Self-pay | Admitting: Family Medicine

## 2018-10-05 VITALS — BP 110/66 | HR 95 | Resp 17 | Ht 61.0 in | Wt 210.4 lb

## 2018-10-05 DIAGNOSIS — Z7689 Persons encountering health services in other specified circumstances: Secondary | ICD-10-CM

## 2018-10-05 DIAGNOSIS — Z1211 Encounter for screening for malignant neoplasm of colon: Secondary | ICD-10-CM

## 2018-10-05 DIAGNOSIS — Z114 Encounter for screening for human immunodeficiency virus [HIV]: Secondary | ICD-10-CM

## 2018-10-05 DIAGNOSIS — Z794 Long term (current) use of insulin: Secondary | ICD-10-CM | POA: Diagnosis not present

## 2018-10-05 DIAGNOSIS — Z1329 Encounter for screening for other suspected endocrine disorder: Secondary | ICD-10-CM

## 2018-10-05 DIAGNOSIS — I10 Essential (primary) hypertension: Secondary | ICD-10-CM

## 2018-10-05 DIAGNOSIS — E119 Type 2 diabetes mellitus without complications: Secondary | ICD-10-CM

## 2018-10-05 DIAGNOSIS — Z1159 Encounter for screening for other viral diseases: Secondary | ICD-10-CM

## 2018-10-05 DIAGNOSIS — Z1231 Encounter for screening mammogram for malignant neoplasm of breast: Secondary | ICD-10-CM

## 2018-10-05 DIAGNOSIS — Z130 Encounter for screening for diseases of the blood and blood-forming organs and certain disorders involving the immune mechanism: Secondary | ICD-10-CM

## 2018-10-05 LAB — GLUCOSE, POCT (MANUAL RESULT ENTRY): POC Glucose: 125 mg/dl — AB (ref 70–99)

## 2018-10-05 NOTE — Progress Notes (Signed)
Alisha Roth, is a 63 y.o. female  UJW:[id number]:[id number]  FAO:[id number]  DOB - 05/12/1956  CC:  Chief Complaint  Patient presents with  . Establish Care  . Diabetes  . Hypertension       HPI: Alisha MutaDiane is a 63 y.o. female is here today to establish care.  Patient moved from [place] and has a limited medical history and medical knowledge related to her personal care.  She does not have medical records from her previous primary care providers.  Reports that she saw her primary care provider earlier this year however is uncertain of the name or location of practice.  Alisha Kathyrn SheriffStanfield has CONDYLOMA ACUMINATUM; SCABIES; TOBACCO ABUSE; DEPRESSION; ALLERGIC RHINITIS; ASTHMA; MENOPAUSAL SYNDROME; SHOULDER PAIN, LEFT; PLANTAR FASCITIS; and HEADACHE on their problem list.  Hysterectomy for fibroid.    Diabetes Patient suffers from type 2 diabetes and is only able to tell me that her diabetes was diagnosed a long time ago.  She reports her last known A1c was in the sixes.  She is currently prescribed Victoza and units of [place]-B[place]uresiba.  Brings with her today a log of her blood sugar readings and all have ranged in the 50s to the 80s upon awakening in the morning.  She does not have any blood sugar readings during the day however reports she only checks her blood sugar during the day if she feels as if it is low.  She reports she has been on this current regimen for a long time.  She tried and failed metformin due to GI side effects.  At some point she was prescribed for Sica however is uncertain as to why she stopped taking the medication.  Hypertension Reports a long history of hypertension.  Diagnosed a while ago however reports no known elevations in blood pressure since taking lisinopril 10 mg.  She does not routinely check her blood pressure although reports when it has been previously taken by other providers is always been within the normal range.  Denies any history of chest pain or history of CAD.  She has  shortness of breath however has chronic COPD which she is followed by pulmonology.  He is a former smoker and quit approximately 3 years ago.       Current medications: Current Outpatient Medications:  .  albuterol (PROVENTIL HFA;VENTOLIN HFA) 108 (90 Base) MCG/ACT inhaler, Inhale 2 puffs into the lungs every 6 (six) hours as needed for wheezing., Disp: , Rfl:  .  albuterol (PROVENTIL) (2.5 MG/3ML) 0.083% nebulizer solution, Take 2.5 mg by nebulization every 6 (six) hours as needed for wheezing or shortness of breath., Disp: , Rfl:  .  budesonide-formoterol (SYMBICORT) 160-4.5 MCG/ACT inhaler, Inhale into the lungs., Disp: , Rfl:  .  cetirizine (ZYRTEC) 10 MG tablet, Take by mouth., Disp: , Rfl:  .  cyclobenzaprine (FLEXERIL) 10 MG tablet, Take 10 mg by mouth at bedtime., Disp: , Rfl:  .  DULoxetine (CYMBALTA) 60 MG capsule, Take 1 capsule by mouth 2 (two) times daily., Disp: , Rfl:  .  Insulin Degludec (TRESIBA) 100 UNIT/ML SOLN, Inject 70 Units into the skin at bedtime., Disp: , Rfl:  .  liraglutide (VICTOZA) 18 MG/3ML SOPN, Inject into the skin., Disp: , Rfl:  .  lisinopril (PRINIVIL,ZESTRIL) 10 MG tablet, Take by mouth., Disp: , Rfl:  .  meloxicam (MOBIC) 15 MG tablet, Take by mouth., Disp: , Rfl:  .  montelukast (SINGULAIR) 10 MG tablet, Take by mouth., Disp: , Rfl:  .  omeprazole (PRILOSEC) 40  MG capsule, Take by mouth., Disp: , Rfl:  .  QUEtiapine (SEROQUEL XR) 300 MG 24 hr tablet, Take 600 mg by mouth at bedtime. , Disp: , Rfl:    Pertinent family medical history: family history includes Alcohol abuse in her brother and father; Diabetes in her mother and sister.   No Known Allergies  Social History   Socioeconomic History  . Marital status: Single    Spouse name: Not on file  . Number of children: Not on file  . Years of education: Not on file  . Highest education level: Not on file  Occupational History  . Not on file  Social Needs  . Financial resource strain: Not on  file  . Food insecurity:    Worry: Not on file    Inability: Not on file  . Transportation needs:    Medical: Not on file    Non-medical: Not on file  Tobacco Use  . Smoking status: Former Games developer  . Smokeless tobacco: Never Used  . Tobacco comment: quit 2016  Substance and Sexual Activity  . Alcohol use: Never    Frequency: Never  . Drug use: Never  . Sexual activity: Not on file  Lifestyle  . Physical activity:    Days per week: Not on file    Minutes per session: Not on file  . Stress: Not on file  Relationships  . Social connections:    Talks on phone: Not on file    Gets together: Not on file    Attends religious service: Not on file    Active member of club or organization: Not on file    Attends meetings of clubs or organizations: Not on file    Relationship status: Not on file  . Intimate partner violence:    Fear of current or ex partner: Not on file    Emotionally abused: Not on file    Physically abused: Not on file    Forced sexual activity: Not on file  Other Topics Concern  . Not on file  Social History Narrative  . Not on file    Review of Systems: Pertinent negatives listed in HPI  Objective:   Vitals:   10/05/18 0859  BP: 110/66  Pulse: 95  Resp: 17  SpO2: 95%    BP Readings from Last 3 Encounters:  10/05/18 110/66  08/31/18 100/62    Filed Weights   10/05/18 0859  Weight: 210 lb 6.4 oz (95.4 kg)      Physical Exam: Constitutional: Patient appears well-developed and well-nourished. No distress. HENT: Normocephalic, atraumatic, External right and left ear normal.  Eyes: Conjunctivae and EOM are normal. PERRLA, no scleral icterus. Neck: Normal ROM. Neck supple. No JVD. No tracheal deviation. No thyromegaly. CVS: RRR, S1/S2 +, no murmurs, no gallops, no carotid bruit.  Pulmonary: Effort and breath sounds normal, no stridor, rhonchi, wheezes, rales.  Abdominal: Soft. BS +, no distension, tenderness, rebound or guarding.   Musculoskeletal: Normal range of motion. No edema and no tenderness.  Neuro: Alert. Normal muscle tone coordination. Normal gait. BUE and BLE strength 5/5. Bilateral hand grips symmetrical. Skin: Skin is warm and dry. No rash noted. Not diaphoretic. No erythema. No pallor. Psychiatric: Normal mood and affect. Behavior, judgment, thought content normal.  Lab Results (prior encounters)  Lab Results  Component Value Date   WBC 10.3 10/20/2007   HGB 13.5 10/20/2007   HCT 41.3 10/20/2007   MCV 89.8 10/20/2007   PLT 411 (H) 10/20/2007  Lab Results  Component Value Date   CREATININE 0.74 10/20/2007   BUN 8 10/20/2007   NA 140 10/20/2007   K 4.1 10/20/2007   CL 107 10/20/2007   CO2 21 10/20/2007        Component Value Date/Time   CHOL 158 10/20/2007 2137   TRIG 112 10/20/2007 2137   HDL 63 10/20/2007 2137   CHOLHDL 2.5 Ratio 10/20/2007 2137   VLDL 22 10/20/2007 2137   LDLCALC 73 10/20/2007 2137        Assessment and plan:  1. Encounter to establish care  2. Type 2 diabetes mellitus without complication, with long-term current use of insulin (HCC)-holding Tresiba until labs result given patient's recent consistent hypoglycemia.  Given lack of history and any previous notes to refer to, will check the following labs and make additional medication adjustments based on clinical picture captured per lab results: Checking  - Microalbumin/Creatinine Ratio, Urine - Glucose (CBG) - Hemoglobin A1c - CBC with Differential -Referring patient's diabetes nutrition and education.  3. Essential hypertension, controlled  We have discussed target BP range and blood pressure goal. I have advised patient to check BP regularly and to call us back or report to clinic if the numbers are consistently higher than 140/90. We discussed the importance of compliance with medical therapy and DASH diet recommended, consequences of uncontrolled hypertension discussed.  - Continue current BP  medications Checking: - Comprehensive metabolic panel  4. Screening for thyroid disorder - TSH  5. Screening for deficiency anemia -Checking CBC    Return in about 4 weeks (around 11/02/2018) for Diabetes management.   The patient was given clear instructions to go to ER or return to medical center if symptoms don't improve, worsen or new problems develop. The patient verbalized understanding. The patient was advised  to call and obtain lab results if they haven't heard anything from out office within 7-10 business days.  Joaquin CourtsKimberly Mahmud Keithly, FNP [facility] [address] [place] [id number] [phone]: [phone]    This note has been created with Dragon speech recognition software and Paediatric nursesmart phrase technology. Any transcriptional errors are unintentional.

## 2018-10-05 NOTE — Patient Instructions (Addendum)
Thank you for choosing Primary Care at [facility] to be your medical home!    Alisha Roth was seen by Joaquin Courts, FNP today.   Frankey Shown primary care provider is Bing Neighbors, FNP.   For the best care possible, you should try to see Joaquin Courts, FNP-C whenever you come to the clinic.   We look forward to seeing you again soon!  If you have any questions about your visit today, please call us at [phone] or feel free to reach your primary care provider via MyChart.    I have discontinued your Evaristo Bury for now.  Do not throw medication away however I am discontinuing given the low readings you are experiencing at home.  Continue the Victoza as prescribed along with all other medications you are prescribed.  I will see you back in office in 4 weeks.  I am also referring you to diabetes nutrition and education to help with dietary selections.  I encourage you to engage in some form of physical activity which will help facilitate weight loss.  Be notified of your lab results and any medication changes warranted by those lab results via phone.  See you back in 4 weeks.     Diabetes Mellitus and Nutrition, Adult When you have diabetes (diabetes mellitus), it is very important to have healthy eating habits because your blood sugar (glucose) levels are greatly affected by what you eat and drink. Eating healthy foods in the appropriate amounts, at about the same times every day, can help you:  Control your blood glucose.  Lower your risk of heart disease.  Improve your blood pressure.  Reach or maintain a healthy weight. Every person with diabetes is different, and each person has different needs for a meal plan. Your health care provider may recommend that you work with a diet and nutrition specialist (dietitian) to make a meal plan that is best for you. Your meal plan may vary depending on factors such as:  The calories you need.  The medicines you  take.  Your weight.  Your blood glucose, blood pressure, and cholesterol levels.  Your activity level.  Other health conditions you have, such as heart or kidney disease. How do carbohydrates affect me? Carbohydrates, also called carbs, affect your blood glucose level more than any other type of food. Eating carbs naturally raises the amount of glucose in your blood. Carb counting is a method for keeping track of how many carbs you eat. Counting carbs is important to keep your blood glucose at a healthy level, especially if you use insulin or take certain oral diabetes medicines. It is important to know how many carbs you can safely have in each meal. This is different for every person. Your dietitian can help you calculate how many carbs you should have at each meal and for each snack. Foods that contain carbs include:  Bread, cereal, rice, pasta, and crackers.  Potatoes and corn.  Peas, beans, and lentils.  Milk and yogurt.  Fruit and juice.  Desserts, such as cakes, cookies, ice cream, and candy. How does alcohol affect me? Alcohol can cause a sudden decrease in blood glucose (hypoglycemia), especially if you use insulin or take certain oral diabetes medicines. Hypoglycemia can be a life-threatening condition. Symptoms of hypoglycemia (sleepiness, dizziness, and confusion) are similar to symptoms of having too much alcohol. If your health care provider says that alcohol is safe for you, follow these guidelines:  Limit alcohol intake to no more than 1  drink per day for nonpregnant women and 2 drinks per day for men. One drink equals 12 oz of beer, 5 oz of wine, or 1 oz of hard liquor.  Do not drink on an empty stomach.  Keep yourself hydrated with water, diet soda, or unsweetened iced tea.  Keep in mind that regular soda, juice, and other mixers may contain a lot of sugar and must be counted as carbs. What are tips for following this plan?  Reading food labels  Start by  checking the serving size on the "Nutrition Facts" label of packaged foods and drinks. The amount of calories, carbs, fats, and other nutrients listed on the label is based on one serving of the item. Many items contain more than one serving per package.  Check the total grams (g) of carbs in one serving. You can calculate the number of servings of carbs in one serving by dividing the total carbs by 15. For example, if a food has 30 g of total carbs, it would be equal to 2 servings of carbs.  Check the number of grams (g) of saturated and trans fats in one serving. Choose foods that have low or no amount of these fats.  Check the number of milligrams (mg) of salt (sodium) in one serving. Most people should limit total sodium intake to less than 2,300 mg per day.  Always check the nutrition information of foods labeled as "low-fat" or "nonfat". These foods may be higher in added sugar or refined carbs and should be avoided.  Talk to your dietitian to identify your daily goals for nutrients listed on the label. Shopping  Avoid buying canned, premade, or processed foods. These foods tend to be high in fat, sodium, and added sugar.  Shop around the outside edge of the grocery store. This includes fresh fruits and vegetables, bulk grains, fresh meats, and fresh dairy. Cooking  Use low-heat cooking methods, such as baking, instead of high-heat cooking methods like deep frying.  Cook using healthy oils, such as olive, canola, or sunflower oil.  Avoid cooking with butter, cream, or high-fat meats. Meal planning  Eat meals and snacks regularly, preferably at the same times every day. Avoid going long periods of time without eating.  Eat foods high in fiber, such as fresh fruits, vegetables, beans, and whole grains. Talk to your dietitian about how many servings of carbs you can eat at each meal.  Eat 4-6 ounces (oz) of lean protein each day, such as lean meat, chicken, fish, eggs, or tofu. One oz  of lean protein is equal to: ? 1 oz of meat, chicken, or fish. ? 1 egg. ?  cup of tofu.  Eat some foods each day that contain healthy fats, such as avocado, nuts, seeds, and fish. Lifestyle  Check your blood glucose regularly.  Exercise regularly as told by your health care provider. This may include: ? 150 minutes of moderate-intensity or vigorous-intensity exercise each week. This could be brisk walking, biking, or water aerobics. ? Stretching and doing strength exercises, such as yoga or weightlifting, at least 2 times a week.  Take medicines as told by your health care provider.  Do not use any products that contain nicotine or tobacco, such as cigarettes and e-cigarettes. If you need help quitting, ask your health care provider.  Work with a Veterinary surgeon or diabetes educator to identify strategies to manage stress and any emotional and social challenges. Questions to ask a health care provider  Do I need to  meet with a diabetes educator?  Do I need to meet with a dietitian?  What number can I call if I have questions?  When are the best times to check my blood glucose? Where to find more information:  American Diabetes Association: diabetes.org  Academy of Nutrition and Dietetics: www.eatright.[employer]  National Institute of Diabetes and Digestive and Kidney Diseases (NIH): Car[employer].tnwww.niddk.nih.gov Summary  A healthy meal plan will help you control your blood glucose and maintain a healthy lifestyle.  Working with a diet and nutrition specialist (dietitian) can help you make a meal plan that is best for you.  Keep in mind that carbohydrates (carbs) and alcohol have immediate effects on your blood glucose levels. It is important to count carbs and to use alcohol carefully. This information is not intended to replace advice given to you by your health care provider. Make sure you discuss any questions you have with your health care provider. Document Released: 05/30/2005 Document  Revised: 04/02/2017 Document Reviewed: 10/07/2016 Elsevier Interactive Patient Education  2019 [employer]

## 2018-10-06 LAB — CBC WITH DIFFERENTIAL/PLATELET
BASOS: 0 %
Basophils Absolute: 0 10*3/uL (ref 0.0–0.2)
EOS (ABSOLUTE): 0.1 10*3/uL (ref 0.0–0.4)
Eos: 2 %
Hematocrit: 35.9 % (ref 34.0–46.6)
Hemoglobin: 11.8 g/dL (ref 11.1–15.9)
Immature Grans (Abs): 0 10*3/uL (ref 0.0–0.1)
Immature Granulocytes: 0 %
LYMPHS ABS: 1.8 10*3/uL (ref 0.7–3.1)
Lymphs: 20 %
MCH: 28.2 pg (ref 26.6–33.0)
MCHC: 32.9 g/dL (ref 31.5–35.7)
MCV: 86 fL (ref 79–97)
MONOS ABS: 0.5 10*3/uL (ref 0.1–0.9)
Monocytes: 5 %
NEUTROS ABS: 6.4 10*3/uL (ref 1.4–7.0)
Neutrophils: 73 %
Platelets: 313 10*3/uL (ref 150–450)
RBC: 4.18 x10E6/uL (ref 3.77–5.28)
RDW: 13.5 % (ref 11.7–15.4)
WBC: 8.9 10*3/uL (ref 3.4–10.8)

## 2018-10-06 LAB — COMPREHENSIVE METABOLIC PANEL
A/G RATIO: 1.7 (ref 1.2–2.2)
ALK PHOS: 76 IU/L (ref 39–117)
ALT: 14 IU/L (ref 0–32)
AST: 15 IU/L (ref 0–40)
Albumin: 4 g/dL (ref 3.8–4.8)
BUN/Creatinine Ratio: 22 (ref 12–28)
BUN: 22 mg/dL (ref 8–27)
CALCIUM: 9 mg/dL (ref 8.7–10.3)
CHLORIDE: 105 mmol/L (ref 96–106)
CO2: 23 mmol/L (ref 20–29)
Creatinine, Ser: 1 mg/dL (ref 0.57–1.00)
GFR calc Af Amer: 69 mL/min/{1.73_m2} (ref >59)
GFR, EST NON AFRICAN AMERICAN: 60 mL/min/{1.73_m2} (ref >59)
Globulin, Total: 2.3 g/dL (ref 1.5–4.5)
Glucose: 131 mg/dL — ABNORMAL HIGH (ref 65–99)
POTASSIUM: 5.1 mmol/L (ref 3.5–5.2)
SODIUM: 145 mmol/L — AB (ref 134–144)
Total Protein: 6.3 g/dL (ref 6.0–8.5)

## 2018-10-06 LAB — HEMOGLOBIN A1C
ESTIMATED AVERAGE GLUCOSE: 148 mg/dL
HEMOGLOBIN A1C: 6.8 % — AB (ref 4.8–5.6)

## 2018-10-06 LAB — MICROALBUMIN / CREATININE URINE RATIO: CREATININE, UR: 119.4 mg/dL

## 2018-10-06 LAB — HIV ANTIBODY (ROUTINE TESTING W REFLEX): HIV Screen 4th Generation wRfx: NONREACTIVE

## 2018-10-06 LAB — HEPATITIS C VRS RNA DETECT BY PCR-QUAL

## 2018-10-06 LAB — TSH: TSH: 1.1 u[IU]/mL (ref 0.450–4.500)

## 2018-10-09 NOTE — Progress Notes (Signed)
Patient notified of results & recommendations. Expressed understanding.

## 2018-10-12 ENCOUNTER — Telehealth: Payer: Self-pay | Admitting: Family Medicine

## 2018-10-12 NOTE — Telephone Encounter (Signed)
Patient called stating that she has been checking her blood sugar through out the days and has been checking it after her meals and patient reports her blood sugar has ranged from 160-230. Patient would like to know if she should pick up her insulin and start using it. Please follow up.

## 2018-10-12 NOTE — Telephone Encounter (Signed)
Spoke with patient & she states that she has been making the dietary changes as discussed during last visit. States that she has tried Metformin in the past & it caused horrible diarrhea. She wants to know if there is an alternative oral medication she can try in the place of the Metformin.

## 2018-10-12 NOTE — Telephone Encounter (Signed)
Advised patient not to resume [place]. Continue Victoza. Evaristo Bury was causing her to experience dangerously low blood sugar readings. I will add low dose metformin if she has not taken into considerations, the dietary changes I recommended. She was also advised to  check her blood sugar two hours following a meal or her blood sugars will be elevated immediately after eating especially if she is eating foods high in carbs such as breads, pasta, and rice.  She is scheduled to follow-up in 1 month for me to recheck A1C after she's been off of the [place] or one month.

## 2018-10-13 NOTE — Telephone Encounter (Signed)
She will need to schedule an appointment with me before I introduce any further changes. I would like at two weeks of fasting AM blood sugars and 2 hour post prandial blood sugar readings. She was only seen in office in 1/20 (1 week ago), she can a follow-up 2/6 or 2/7 and bring these readings.

## 2018-10-14 NOTE — Telephone Encounter (Signed)
Called patient, LVM to call back to schedule that appointment.

## 2018-10-20 NOTE — Addendum Note (Signed)
Addended by: Bing Neighbors on: 10/20/2018 05:11 PM   Modules accepted: Orders

## 2018-10-22 ENCOUNTER — Ambulatory Visit: Payer: Medicare Other

## 2018-10-22 ENCOUNTER — Encounter: Payer: Self-pay | Admitting: Family Medicine

## 2018-10-22 ENCOUNTER — Ambulatory Visit (INDEPENDENT_AMBULATORY_CARE_PROVIDER_SITE_OTHER): Payer: Medicare Other | Admitting: Family Medicine

## 2018-10-22 VITALS — BP 125/79 | HR 82 | Resp 17 | Ht 61.0 in | Wt 206.0 lb

## 2018-10-22 DIAGNOSIS — Z794 Long term (current) use of insulin: Secondary | ICD-10-CM

## 2018-10-22 DIAGNOSIS — M25562 Pain in left knee: Secondary | ICD-10-CM

## 2018-10-22 DIAGNOSIS — E1165 Type 2 diabetes mellitus with hyperglycemia: Secondary | ICD-10-CM | POA: Diagnosis not present

## 2018-10-22 DIAGNOSIS — E669 Obesity, unspecified: Secondary | ICD-10-CM

## 2018-10-22 DIAGNOSIS — Z6838 Body mass index (BMI) 38.0-38.9, adult: Secondary | ICD-10-CM

## 2018-10-22 DIAGNOSIS — E119 Type 2 diabetes mellitus without complications: Secondary | ICD-10-CM

## 2018-10-22 LAB — GLUCOSE, POCT (MANUAL RESULT ENTRY): POC GLUCOSE: 238 mg/dL — AB (ref 70–99)

## 2018-10-22 MED ORDER — KETOROLAC TROMETHAMINE 30 MG/ML IJ SOLN
30.0000 mg | Freq: Once | INTRAMUSCULAR | Status: AC
  Administered 2018-10-22: 30 mg via INTRAMUSCULAR

## 2018-10-22 MED ORDER — SITAGLIPTIN PHOSPHATE 50 MG PO TABS
50.0000 mg | ORAL_TABLET | Freq: Every day | ORAL | 1 refills | Status: DC
Start: 2018-10-22 — End: 2018-11-23

## 2018-10-22 MED ORDER — MELOXICAM 15 MG PO TABS: 15.0000 mg | ORAL_TABLET | Freq: Every day | ORAL | 1 refills | Status: DC

## 2018-10-22 MED ORDER — DICLOFENAC SODIUM 1 % TD GEL: 4.0000 g | Freq: Four times a day (QID) | TRANSDERMAL | 0 refills | Status: DC

## 2018-10-22 NOTE — Patient Instructions (Addendum)
Review medication list attached and take all medications as prescribed today.   Acute Knee Pain, Adult Many things can cause knee pain. Sometimes, knee pain is sudden (acute) and may be caused by damage, swelling, or irritation of the muscles and tissues that support your knee. The pain often goes away on its own with time and rest. If the pain does not go away, tests may be done to find out what is causing the pain. Follow these instructions at home: Pay attention to any changes in your symptoms. Take these actions to relieve your pain. If you have a knee sleeve or brace:   Wear the sleeve or brace as told by your doctor. Remove it only as told by your doctor.  Loosen the sleeve or brace if your toes: ? Tingle. ? Become numb. ? Turn cold and blue.  Keep the sleeve or brace clean.  If the sleeve or brace is not waterproof: ? Do not let it get wet. ? Cover it with a watertight covering when you take a bath or shower. Activity  Rest your knee.  Do not do things that cause pain.  Avoid activities where both feet leave the ground at the same time (high-impact activities). Examples are running, jumping rope, and doing jumping jacks.  Work with a physical therapist to make a safe exercise program, as told by your doctor. Managing pain, stiffness, and swelling   If told, put ice on the knee: ? Put ice in a plastic bag. ? Place a towel between your skin and the bag. ? Leave the ice on for 20 minutes, 2-3 times a day.  If told, put pressure (compression) on your injured knee to control swelling, give support, and help with discomfort. Compression may be done with an elastic bandage. General instructions  Take all medicines only as told by your doctor.  Raise (elevate) your knee while you are sitting or lying down. Make sure your knee is higher than your heart.  Sleep with a pillow under your knee.  Do not use any products that contain nicotine or tobacco. These include  cigarettes, e-cigarettes, and chewing tobacco. These products may slow down healing. If you need help quitting, ask your doctor.  If you are overweight, work with your doctor and a food expert (dietitian) to set goals to lose weight. Being overweight can make your knee hurt more.  Keep all follow-up visits as told by your doctor. This is important. Contact a doctor if:  The knee pain does not stop.  The knee pain changes or gets worse.  You have a fever along with knee pain.  Your knee feels warm when you touch it.  Your knee gives out or locks up. Get help right away if:  Your knee swells, and the swelling gets worse.  You cannot move your knee.  You have very bad knee pain. Summary  Many things can cause knee pain. The pain often goes away on its own with time and rest.  Your doctor may do tests to find out the cause of the pain.  Pay attention to any changes in your symptoms. Relieve your pain with rest, medicines, light activity, and use of ice.  Get help right away if you cannot move your knee or your knee pain is very bad. This information is not intended to replace advice given to you by your health care provider. Make sure you discuss any questions you have with your health care provider. Document Released: 11/29/2008 Document Revised:  02/12/2018 Document Reviewed: 02/12/2018 Elsevier Interactive Patient Education  [employer]2019 Elsevier Inc.

## 2018-10-22 NOTE — Progress Notes (Signed)
Established Patient Office Visit  Subjective:  Patient ID: Alisha Roth, female    DOB: [birth year]  Age: 63 y.o. MRN: [id number]  CC:  Chief Complaint  Patient presents with  . Diabetes    HPI Alisha Roth presents for diabetes follow-up  Patient was seen in office 10/05/18 to establish care. During that visit, patient complained of daily hypoglycemia occurring multiple times per day. She was prescribed Treseba 70 units once daily and Victoza. At that, visit Alisha Roth was listed on her medication list, but patient reported she was taking medication. Labs revealed an A1C 6.8 and patient was advised to discontinue [place]. She is intolerant of Metformin. Reports blood sugars have been high after meals ranging in 200's. She brings readings today reflecting readings 150-220. She did not her meter and she is not checking her blood sugars in the morning fasting either.  She also did not bring her medications with her as she has poor literacy regarding the names of medications in which she takes.  Today she reports that she thinks she is taking the for [place] "my night pill". Denies 3 P's.   Left knee pain  Pain with prolonged standing and with sitting. Pain is mostly medial lateral and radiates down mid lower leg. No injuries or falls.  She is not currently taking any medication for pain. She has not tried warm or cool applications.  Past Medical History:  Diagnosis Date  . Asthma   . COPD (chronic obstructive pulmonary disease) (HCC)   . DM (diabetes mellitus) (HCC)   . Former smoker Quit 2016  . Obesity   . Seasonal allergies     Past Surgical History:  Procedure Laterality Date  . ABDOMINAL HYSTERECTOMY    . CESAREAN SECTION    . LAPAROSCOPIC CHOLECYSTECTOMY      Family History  Problem Relation Age of Onset  . Diabetes Mother   . Alcohol abuse Father   . Diabetes Sister   . Alcohol abuse Brother     Social History   Socioeconomic History  . Marital status: Single     Spouse name: Not on file  . Number of children: Not on file  . Years of education: Not on file  . Highest education level: Not on file  Occupational History  . Not on file  Social Needs  . Financial resource strain: Not on file  . Food insecurity:    Worry: Not on file    Inability: Not on file  . Transportation needs:    Medical: Not on file    Non-medical: Not on file  Tobacco Use  . Smoking status: Former Games developer  . Smokeless tobacco: Never Used  . Tobacco comment: quit 2016  Substance and Sexual Activity  . Alcohol use: Never    Frequency: Never  . Drug use: Never  . Sexual activity: Not on file  Lifestyle  . Physical activity:    Days per week: Not on file    Minutes per session: Not on file  . Stress: Not on file  Relationships  . Social connections:    Talks on phone: Not on file    Gets together: Not on file    Attends religious service: Not on file    Active member of club or organization: Not on file    Attends meetings of clubs or organizations: Not on file    Relationship status: Not on file  . Intimate partner violence:    Fear of current or ex partner: Not  on file    Emotionally abused: Not on file    Physically abused: Not on file    Forced sexual activity: Not on file  Other Topics Concern  . Not on file  Social History Narrative  . Not on file    Outpatient Medications Prior to Visit  Medication Sig Dispense Refill  . dapagliflozin propanediol (FARXIGA) 10 MG TABS tablet Take 10 mg by mouth daily. Pt takes at bedtime    . albuterol (PROVENTIL HFA;VENTOLIN HFA) 108 (90 Base) MCG/ACT inhaler Inhale 2 puffs into the lungs every 6 (six) hours as needed for wheezing.    Marland Kitchen. albuterol (PROVENTIL) (2.5 MG/3ML) 0.083% nebulizer solution Take 2.5 mg by nebulization every 6 (six) hours as needed for wheezing or shortness of breath.    . budesonide-formoterol (SYMBICORT) 160-4.5 MCG/ACT inhaler Inhale into the lungs.    . cetirizine (ZYRTEC) 10 MG tablet Take  by mouth.    . cyclobenzaprine (FLEXERIL) 10 MG tablet Take 10 mg by mouth at bedtime.    . DULoxetine (CYMBALTA) 60 MG capsule Take 1 capsule by mouth 2 (two) times daily.    Marland Kitchen. liraglutide (VICTOZA) 18 MG/3ML SOPN Inject 1.8 mg into the skin. Takes in daily morning     . lisinopril (PRINIVIL,ZESTRIL) 10 MG tablet Take by mouth. Take daily     . meloxicam (MOBIC) 15 MG tablet Take by mouth.    . montelukast (SINGULAIR) 10 MG tablet Take by mouth.    Marland Kitchen. omeprazole (PRILOSEC) 40 MG capsule Take by mouth.    . QUEtiapine (SEROQUEL XR) 300 MG 24 hr tablet Take 600 mg by mouth at bedtime.     Marland Kitchen. SPIRIVA HANDIHALER 18 MCG inhalation capsule Place 1 capsule into inhaler and inhale daily.     No facility-administered medications prior to visit.     Allergies  Allergen Reactions  . Metformin And Related Diarrhea    ROS Review of Systems    Objective:    Physical Exam  BP 125/79   Pulse 82   Resp 17   Ht 5\' 1"  (1.549 m)   Wt 206 lb (93.4 kg)   SpO2 95%   BMI 38.92 kg/m    General appearance: alert, well developed, well nourished, cooperative and in no distress Head: Normocephalic, without obvious abnormality, atraumatic Respiratory: Respirations even and unlabored, normal respiratory rate Extremities: Left knee: no swelling , erythema, or evidence of effusion. Tenderness with palpation medial lateral ligament Skin: Skin color, texture, turgor normal. No rashes seen  Psych: Appropriate mood and affect. Neurologic: Mental status: Alert, oriented to person, place, and time, thought content appropriate. Wt Readings from Last 3 Encounters:  10/22/18 206 lb (93.4 kg)  10/05/18 210 lb 6.4 oz (95.4 kg)  08/31/18 209 lb 9.6 oz (95.1 kg)     Health Maintenance Due  Topic Date Due  . Hepatitis C Screening  [birth year]  . MAMMOGRAM  04/22/2005  . COLONOSCOPY  04/22/2005    There are no preventive care reminders to display for this patient.  Lab Results  Component Value Date   TSH  1.100 10/05/2018   Lab Results  Component Value Date   WBC 8.9 10/05/2018   HGB 11.8 10/05/2018   HCT 35.9 10/05/2018   MCV 86 10/05/2018   PLT 313 10/05/2018   Lab Results  Component Value Date   NA 145 (H) 10/05/2018   K 5.1 10/05/2018   CO2 23 10/05/2018   GLUCOSE 131 (H) 10/05/2018   BUN 22 10/05/2018  CREATININE 1.00 10/05/2018   BILITOT <0.2 10/05/2018   ALKPHOS 76 10/05/2018   AST 15 10/05/2018   ALT 14 10/05/2018   PROT 6.3 10/05/2018   ALBUMIN 4.0 10/05/2018   CALCIUM 9.0 10/05/2018   Lab Results  Component Value Date   CHOL 158 10/20/2007   Lab Results  Component Value Date   HDL 63 10/20/2007   Lab Results  Component Value Date   LDLCALC 73 10/20/2007   Lab Results  Component Value Date   TRIG 112 10/20/2007   Lab Results  Component Value Date   CHOLHDL 2.5 Ratio 10/20/2007   Lab Results  Component Value Date   HGBA1C 6.8 (H) 10/05/2018      Assessment & Plan:  1. Type 2 diabetes mellitus without complication, with long-term current use of insulin (HCC)-Adding Januvia 50 mg once daily. Recommending continue Marcelline DeistFarxiga if she is currently taking at home. Difficult to known which medications patient is currently taking as she has poor literacy regarding medications. I have asked that she brings all medications with her to next office visit, along with her meter. - Glucose (CBG)  2. Acute pain of left knee - DG Knee Complete 4 Views Left; Future - ketorolac (TORADOL) 30 MG/ML injection 30 mg -Recommended Mobic once daily and diclofenac gel   Meds ordered this encounter  Medications  . sitaGLIPtin (JANUVIA) 50 MG tablet    Sig: Take 1 tablet (50 mg total) by mouth daily.    Dispense:  90 tablet    Refill:  1  . meloxicam (MOBIC) 15 MG tablet    Sig: Take 1 tablet (15 mg total) by mouth daily.    Dispense:  30 tablet    Refill:  1  . diclofenac sodium (VOLTAREN) 1 % GEL    Sig: Apply 4 g topically 4 (four) times daily.    Dispense:  100 g     Refill:  0  . ketorolac (TORADOL) 30 MG/ML injection 30 mg    Follow-up:  1 months diabetes management    Joaquin CourtsKimberly Alaa Eyerman, FNP-C

## 2018-10-23 ENCOUNTER — Telehealth: Payer: Self-pay | Admitting: Family Medicine

## 2018-10-23 NOTE — Telephone Encounter (Signed)
Contact patient to advise recent knee imaging is normal and shows no evidence of arthritis

## 2018-10-23 NOTE — Progress Notes (Signed)
 @Patient  ID: Alisha Roth, female    DOB: 08/06/1956, 63 y.o.   MRN: [id number]  Chief Complaint  Patient presents with  . Follow-up    review PFT.  pt c/o "cold-like" symptoms- sinus congestion, pnd, cough X1 day.     Referring provider: Kennon RoundsWeaver, Scott T, PA-C  HPI: 63 year old female, current some day smoker (50+ pack year hx). PMH significant for childhood asthma, allergic rhinitis, tobacco abuse. Patient of Dr. Tonia BroomsIcard, seen for initial consult on 08/31/18 for shortness of breath on exertion with intermittent cough. Recently treated bronchitis. Lack of clear medical history. Appears patient was diagnosed with COPD by pulmonologist in [place] and is on Symbicort, Spiriva and as needed Albuterol. Along with, what is believed to be, azithromycin on MWF. Plan to continue current inhaler regimen and obtain PFTs.   10/26/2018 Patient presents today for 2 month follow-up with PFTs. Patient recently moved from [place] 6 months ago. Established care with Dr. Tonia BroomsIcard in December 2019. States that her breathing is ok. Gets anxious and short of breath with walking. Reports that she is getting cold symptoms and does not feel all that great. She has a cough with some mucus production. No wheezing. She did not take inhalers today d/t PFTs. Hx bronchitis, she was on daily antibiotic which she believes was azithromycin on MWF. She did not bring in bottle with her because RX ran out and she threw it away. She does have Symbicort 160 inhaler with her.  She has not required albuterol since moving. Recently started smoking again, approx 2 cigarettes a day. Previously she had quit for 3 years. She has established with PCP. Needs refills of her inhalers.   Significant testing: 10/26/2018 - FENO 16  10/26/2018 PFTs - FVC 1.97 (68%), FEV1 1.34 (60%), ratio 68, +BD response, normal DLCO  Allergies  Allergen Reactions  . Metformin And Related Diarrhea    Immunization History  Administered Date(s) Administered  .  Influenza Whole 09/01/2008, 11/09/2009  . Influenza-Unspecified 08/03/2018  . Pneumococcal Polysaccharide-23 11/09/2009  . Td 05/16/2018    Past Medical History:  Diagnosis Date  . Asthma   . COPD (chronic obstructive pulmonary disease) (HCC)   . DM (diabetes mellitus) (HCC)   . Former smoker Quit 2016  . Obesity   . Seasonal allergies     Tobacco History: Social History   Tobacco Use  Smoking Status Former Smoker  Smokeless Tobacco Never Used  Tobacco Comment   quit 2016   Counseling given: Not Answered Comment: quit 2016   Outpatient Medications Prior to Visit  Medication Sig Dispense Refill  . busPIRone (BUSPAR) 10 MG tablet Take 10 mg by mouth 2 (two) times daily as needed.    . cetirizine (ZYRTEC) 10 MG tablet Take by mouth.    . cyclobenzaprine (FLEXERIL) 10 MG tablet Take 10 mg by mouth at bedtime.    . dapagliflozin propanediol (FARXIGA) 10 MG TABS tablet Take 10 mg by mouth daily. Pt takes at bedtime    . diclofenac sodium (VOLTAREN) 1 % GEL Apply 4 g topically 4 (four) times daily. 100 g 0  . DULoxetine (CYMBALTA) 60 MG capsule Take 1 capsule by mouth 2 (two) times daily.    Marland Kitchen. liraglutide (VICTOZA) 18 MG/3ML SOPN Inject 1.8 mg into the skin. Takes in daily morning     . lisinopril (PRINIVIL,ZESTRIL) 10 MG tablet Take by mouth. Take daily     . meloxicam (MOBIC) 15 MG tablet Take 1 tablet (15 mg total) by  mouth daily. 30 tablet 1  . montelukast (SINGULAIR) 10 MG tablet Take by mouth.    Marland Kitchen. omeprazole (PRILOSEC) 40 MG capsule Take by mouth.    . QUEtiapine (SEROQUEL XR) 300 MG 24 hr tablet Take 600 mg by mouth at bedtime.     . sitaGLIPtin (JANUVIA) 50 MG tablet Take 1 tablet (50 mg total) by mouth daily. 90 tablet 1  . albuterol (PROVENTIL HFA;VENTOLIN HFA) 108 (90 Base) MCG/ACT inhaler Inhale 2 puffs into the lungs every 6 (six) hours as needed for wheezing.    Marland Kitchen. albuterol (PROVENTIL) (2.5 MG/3ML) 0.083% nebulizer solution Take 2.5 mg by nebulization every 6  (six) hours as needed for wheezing or shortness of breath.    . budesonide-formoterol (SYMBICORT) 160-4.5 MCG/ACT inhaler Inhale into the lungs.    . SPIRIVA HANDIHALER 18 MCG inhalation capsule Place 1 capsule into inhaler and inhale daily.     No facility-administered medications prior to visit.     Review of Systems  Review of Systems  Constitutional: Negative.   HENT: Positive for congestion.   Respiratory: Positive for cough and shortness of breath. Negative for apnea, choking, chest tightness, wheezing and stridor.   Cardiovascular: Negative.   Psychiatric/Behavioral: The patient is nervous/anxious.     Physical Exam  BP 134/66 (BP Location: Left Arm, Cuff Size: Normal)   Pulse (!) 109   Ht 5\' 1"  (1.549 m)   Wt 201 lb (91.2 kg)   SpO2 95%   BMI 37.98 kg/m  Physical Exam Constitutional:      Appearance: Normal appearance. She is not ill-appearing.  HENT:     Right Ear: Tympanic membrane normal.     Left Ear: Tympanic membrane normal.     Mouth/Throat:     Mouth: Mucous membranes are moist.     Pharynx: Oropharynx is clear.  Eyes:     Extraocular Movements: Extraocular movements intact.     Pupils: Pupils are equal, round, and reactive to light.  Neck:     Musculoskeletal: Normal range of motion and neck supple.  Cardiovascular:     Rate and Rhythm: Regular rhythm. Tachycardia present.  Pulmonary:     Effort: Pulmonary effort is normal. No respiratory distress.     Breath sounds: No stridor. No wheezing or rhonchi.     Comments: CTA  Musculoskeletal: Normal range of motion.  Skin:    General: Skin is warm and dry.  Neurological:     General: No focal deficit present.     Mental Status: She is alert and oriented to person, place, and time. Mental status is at baseline.  Psychiatric:        Thought Content: Thought content normal.        Judgment: Judgment normal.      Lab Results:  CBC    Component Value Date/Time   WBC 8.9 10/05/2018 0953   WBC 10.3  10/20/2007 2137   RBC 4.18 10/05/2018 0953   RBC 4.60 10/20/2007 2137   HGB 11.8 10/05/2018 0953   HCT 35.9 10/05/2018 0953   PLT 313 10/05/2018 0953   MCV 86 10/05/2018 0953   MCH 28.2 10/05/2018 0953   MCHC 32.9 10/05/2018 0953   MCHC 32.7 10/20/2007 2137   RDW 13.5 10/05/2018 0953   LYMPHSABS 1.8 10/05/2018 0953   MONOABS 0.7 10/20/2007 2137   EOSABS 0.1 10/05/2018 0953   BASOSABS 0.0 10/05/2018 0953    BMET    Component Value Date/Time   NA 145 (H) 10/05/2018 16100953  K 5.1 10/05/2018 0953   CL 105 10/05/2018 0953   CO2 23 10/05/2018 0953   GLUCOSE 131 (H) 10/05/2018 0953   GLUCOSE 97 10/20/2007 2137   BUN 22 10/05/2018 0953   CREATININE 1.00 10/05/2018 0953   CALCIUM 9.0 10/05/2018 0953   GFRNONAA 60 10/05/2018 0953   GFRAA 69 10/05/2018 0953    BNP No results found for: BNP  ProBNP No results found for: PROBNP  Imaging: Dg Knee Complete 4 Views Left  Result Date: 10/23/2018 CLINICAL DATA:  Recent fall with left knee pain, initial encounter EXAM: LEFT KNEE - COMPLETE 4+ VIEW COMPARISON:  None. FINDINGS: No evidence of fracture, dislocation, or joint effusion. No evidence of arthropathy or other focal bone abnormality. Soft tissues are unremarkable. IMPRESSION: No acute abnormality noted. Electronically Signed   By: Alcide Clever M.D.   On: 10/23/2018 08:13     Assessment & Plan:   63 year old female. Recently started smoking again after quitting in 2016 d/t stress of move from [place]. New patient to [facility] pulmonary, established care with Dr. Tonia Brooms in December. Hx childhood asthma and recent dx of COPD. Continues Symbicort 160 and Spiriva. She has not needed to use prn albuterol since move. PFT completed today showing moderate obstructive lung disease, positive BD response. Possible mixed disease, however, FENO was normal today. Appears she was taking Azithromycin MWF for a period of time d/t freq exacerbations. This RX ran out, she does not have medication with her  today or documentation. Unclear if she really needs daily antibiotic at this time. Plan hold off and discuss with Dr. Tonia Brooms. Complains of shortness of breath with exertion and cough. No significant wheezing. Will treat for mild COPD exacerbation today. Could consider adding daliresp to regimen if needed. Needs to quit smoking.   Stage 2 moderate COPD by GOLD classification (HCC) - FVC 1.97 (68%), FEV1 1.34 (60%), ratio 68 - Moderate obstruction with +BD response, normal DLCO - Mild exacerbation for COPD symptoms today d/t bronchitis, RX doxycyline x1 week  - Continue Symbicort 160 and Spiriva; prn albuterol - Strongly encouraged complete smoking cessation - FU in 4 weeks, consider adding daliresp or resuming daily azithromycin    Glenford Bayley, NP 10/26/2018

## 2018-10-23 NOTE — Telephone Encounter (Signed)
Patient notified of xray results. Expressed understanding.

## 2018-10-26 ENCOUNTER — Ambulatory Visit (INDEPENDENT_AMBULATORY_CARE_PROVIDER_SITE_OTHER): Payer: Medicare Other | Admitting: Pulmonary Disease

## 2018-10-26 ENCOUNTER — Encounter: Payer: Self-pay | Admitting: Primary Care

## 2018-10-26 ENCOUNTER — Ambulatory Visit (INDEPENDENT_AMBULATORY_CARE_PROVIDER_SITE_OTHER): Payer: Medicare Other | Admitting: Primary Care

## 2018-10-26 VITALS — BP 134/66 | HR 109 | Ht 61.0 in | Wt 201.0 lb

## 2018-10-26 DIAGNOSIS — J449 Chronic obstructive pulmonary disease, unspecified: Secondary | ICD-10-CM

## 2018-10-26 LAB — PULMONARY FUNCTION TEST
DL/VA % pred: 115 %
DL/VA: 4.97 ml/min/mmHg/L
DLCO COR % PRED: 80 %
DLCO UNC % PRED: 76 %
DLCO UNC: 13.71 ml/min/mmHg
DLCO cor: 14.47 ml/min/mmHg
FEF 25-75 POST: 1.27 L/s
FEF 25-75 Pre: 0.8 L/sec
FEF2575-%CHANGE-POST: 59 %
FEF2575-%PRED-POST: 61 %
FEF2575-%Pred-Pre: 38 %
FEV1-%CHANGE-POST: 13 %
FEV1-%PRED-PRE: 53 %
FEV1-%Pred-Post: 60 %
FEV1-POST: 1.34 L
FEV1-Pre: 1.17 L
FEV1FVC-%CHANGE-POST: -5 %
FEV1FVC-%PRED-PRE: 93 %
FEV6-%Change-Post: 21 %
FEV6-%Pred-Post: 71 %
FEV6-%Pred-Pre: 59 %
FEV6-PRE: 1.63 L
FEV6-Post: 1.97 L
FEV6FVC-%Change-Post: 0 %
FEV6FVC-%PRED-PRE: 103 %
FEV6FVC-%Pred-Post: 103 %
FVC-%CHANGE-POST: 20 %
FVC-%PRED-POST: 68 %
FVC-%Pred-Pre: 56 %
FVC-Post: 1.97 L
FVC-Pre: 1.63 L
POST FEV1/FVC RATIO: 68 %
PRE FEV6/FVC RATIO: 100 %
Post FEV6/FVC ratio: 100 %
Pre FEV1/FVC ratio: 72 %
RV % PRED: 125 %
RV: 2.39 L
TLC % pred: 90 %
TLC: 4.17 L

## 2018-10-26 LAB — NITRIC OXIDE: Nitric Oxide: 16

## 2018-10-26 MED ORDER — ALBUTEROL SULFATE HFA 108 (90 BASE) MCG/ACT IN AERS: 2.0000 | INHALATION_SPRAY | Freq: Four times a day (QID) | RESPIRATORY_TRACT | 6 refills | Status: DC | PRN

## 2018-10-26 MED ORDER — ALBUTEROL SULFATE (2.5 MG/3ML) 0.083% IN NEBU: 2.5000 mg | INHALATION_SOLUTION | Freq: Four times a day (QID) | RESPIRATORY_TRACT | 6 refills | Status: DC | PRN

## 2018-10-26 MED ORDER — BUDESONIDE-FORMOTEROL FUMARATE 160-4.5 MCG/ACT IN AERO: 2.0000 | INHALATION_SPRAY | Freq: Two times a day (BID) | RESPIRATORY_TRACT | 3 refills | Status: DC

## 2018-10-26 MED ORDER — DOXYCYCLINE HYCLATE 100 MG PO TABS: 100.0000 mg | ORAL_TABLET | Freq: Two times a day (BID) | ORAL | 0 refills | Status: DC

## 2018-10-26 MED ORDER — SPIRIVA HANDIHALER 18 MCG IN CAPS: 1.0000 | ORAL_CAPSULE | Freq: Every day | RESPIRATORY_TRACT | 3 refills | Status: DC

## 2018-10-26 NOTE — Progress Notes (Signed)
Full PFT performed today. °

## 2018-10-26 NOTE — Assessment & Plan Note (Addendum)
-   FVC 1.97 (68%), FEV1 1.34 (60%), ratio 68 - Moderate obstruction with +BD response, normal DLCO - Mild exacerbation for COPD symptoms today d/t bronchitis, RX doxycyline x1 week  - Continue Symbicort 160 and Spiriva; prn albuterol - Strongly encouraged complete smoking cessation - FU in 4 weeks, consider adding daliresp or resuming daily azithromycin

## 2018-10-26 NOTE — Patient Instructions (Addendum)
COPD: PFTs showed moderate obstructive lung disease  Continue Symbicort twice daily and Spiriva once daily Albuterol every 6 hours as needed for sob/wheezing  Strongly encourage you to quit smoking COMPLETELY   Rx: Doxycyline 1 tab twice daily x 7 days Inhaler refills sent   Follow-up: 4 weeks with Dr. Tonia Brooms    Chronic Obstructive Pulmonary Disease Chronic obstructive pulmonary disease (COPD) is a long-term (chronic) lung problem. When you have COPD, it is hard for air to get in and out of your lungs. Usually the condition gets worse over time, and your lungs will never return to normal. There are things you can do to keep yourself as healthy as possible.  Your doctor may treat your condition with: ? Medicines. ? Oxygen. ? Lung surgery.  Your doctor may also recommend: ? Rehabilitation. This includes steps to make your body work better. It may involve a team of specialists. ? Quitting smoking, if you smoke. ? Exercise and changes to your diet. ? Comfort measures (palliative care). Follow these instructions at home: Medicines  Take over-the-counter and prescription medicines only as told by your doctor.  Talk to your doctor before taking any cough or allergy medicines. You may need to avoid medicines that cause your lungs to be dry. Lifestyle  If you smoke, stop. Smoking makes the problem worse. If you need help quitting, ask your doctor.  Avoid being around things that make your breathing worse. This may include smoke, chemicals, and fumes.  Stay active, but remember to rest as well.  Learn and use tips on how to relax.  Make sure you get enough sleep. Most adults need at least 7 hours of sleep every night.  Eat healthy foods. Eat smaller meals more often. Rest before meals. Controlled breathing Learn and use tips on how to control your breathing as told by your doctor. Try:  Breathing in (inhaling) through your nose for 1 second. Then, pucker your lips and breath  out (exhale) through your lips for 2 seconds.  Putting one hand on your belly (abdomen). Breathe in slowly through your nose for 1 second. Your hand on your belly should move out. Pucker your lips and breathe out slowly through your lips. Your hand on your belly should move in as you breathe out.  Controlled coughing Learn and use controlled coughing to clear mucus from your lungs. Follow these steps: 1. Lean your head a little forward. 2. Breathe in deeply. 3. Try to hold your breath for 3 seconds. 4. Keep your mouth slightly open while coughing 2 times. 5. Spit any mucus out into a tissue. 6. Rest and do the steps again 1 or 2 times as needed. General instructions  Make sure you get all the shots (vaccines) that your doctor recommends. Ask your doctor about a flu shot and a pneumonia shot.  Use oxygen therapy and pulmonary rehabilitation if told by your doctor. If you need home oxygen therapy, ask your doctor if you should buy a tool to measure your oxygen level (oximeter).  Make a COPD action plan with your doctor. This helps you to know what to do if you feel worse than usual.  Manage any other conditions you have as told by your doctor.  Avoid going outside when it is very hot, cold, or humid.  Avoid people who have a sickness you can catch (contagious).  Keep all follow-up visits as told by your doctor. This is important. Contact a doctor if:  You cough up more mucus than  usual.  There is a change in the color or thickness of the mucus.  It is harder to breathe than usual.  Your breathing is faster than usual.  You have trouble sleeping.  You need to use your medicines more often than usual.  You have trouble doing your normal activities such as getting dressed or walking around the house. Get help right away if:  You have shortness of breath while resting.  You have shortness of breath that stops you from: ? Being able to talk. ? Doing normal activities.  Your  chest hurts for longer than 5 minutes.  Your skin color is more blue than usual.  Your pulse oximeter shows that you have low oxygen for longer than 5 minutes.  You have a fever.  You feel too tired to breathe normally. Summary  Chronic obstructive pulmonary disease (COPD) is a long-term lung problem.  The way your lungs work will never return to normal. Usually the condition gets worse over time. There are things you can do to keep yourself as healthy as possible.  Take over-the-counter and prescription medicines only as told by your doctor.  If you smoke, stop. Smoking makes the problem worse. This information is not intended to replace advice given to you by your health care provider. Make sure you discuss any questions you have with your health care provider. Document Released: 02/19/2008 Document Revised: 10/07/2016 Document Reviewed: 10/07/2016 Elsevier Interactive Patient Education  2019 [employer]  Steps to Quit Smoking  Smoking tobacco can be bad for your health. It can also affect almost every organ in your body. Smoking puts you and people around you at risk for many serious long-lasting (chronic) diseases. Quitting smoking is hard, but it is one of the best things that you can do for your health. It is never too late to quit. What are the benefits of quitting smoking? When you quit smoking, you lower your risk for getting serious diseases and conditions. They can include:  Lung cancer or lung disease.  Heart disease.  Stroke.  Heart attack.  Not being able to have children (infertility).  Weak bones (osteoporosis) and broken bones (fractures). If you have coughing, wheezing, and shortness of breath, those symptoms may get better when you quit. You may also get sick less often. If you are pregnant, quitting smoking can help to lower your chances of having a baby of low birth weight. What can I do to help me quit smoking? Talk with your doctor about what can  help you quit smoking. Some things you can do (strategies) include:  Quitting smoking totally, instead of slowly cutting back how much you smoke over a period of time.  Going to in-person counseling. You are more likely to quit if you go to many counseling sessions.  Using resources and support systems, such as: ? Agricultural engineernline chats with a Veterinary surgeoncounselor. ? Phone quitlines. ? Automotive engineerrinted self-help materials. ? Support groups or group counseling. ? Text messaging programs. ? Mobile phone apps or applications.  Taking medicines. Some of these medicines may have nicotine in them. If you are pregnant or breastfeeding, do not take any medicines to quit smoking unless your doctor says it is okay. Talk with your doctor about counseling or other things that can help you. Talk with your doctor about using more than one strategy at the same time, such as taking medicines while you are also going to in-person counseling. This can help make quitting easier. What things can I do to  make it easier to quit? Quitting smoking might feel very hard at first, but there is a lot that you can do to make it easier. Take these steps:  Talk to your family and friends. Ask them to support and encourage you.  Call phone quitlines, reach out to support groups, or work with a Veterinary surgeoncounselor.  Ask people who smoke to not smoke around you.  Avoid places that make you want (trigger) to smoke, such as: ? Bars. ? Parties. ? Smoke-break areas at work.  Spend time with people who do not smoke.  Lower the stress in your life. Stress can make you want to smoke. Try these things to help your stress: ? Getting regular exercise. ? Deep-breathing exercises. ? Yoga. ? Meditating. ? Doing a body scan. To do this, close your eyes, focus on one area of your body at a time from head to toe, and notice which parts of your body are tense. Try to relax the muscles in those areas.  Download or buy apps on your mobile phone or tablet that can help  you stick to your quit plan. There are many free apps, such as QuitGuide from the [employer] developer(Centers for Disease Control and Prevention). You can find more support from smokefree.gov and other websites. This information is not intended to replace advice given to you by your health care provider. Make sure you discuss any questions you have with your health care provider. Document Released: 06/29/2009 Document Revised: 04/30/2016 Document Reviewed: 01/17/2015 Elsevier Interactive Patient Education  2019 [employer]

## 2018-10-27 NOTE — Progress Notes (Signed)
PCCM: Agree. The patient MUST quit smoking.  Josephine Igo, DO [facility] 10/27/2018 5:57 PM

## 2018-10-29 ENCOUNTER — Ambulatory Visit
Admission: RE | Admit: 2018-10-29 | Discharge: 2018-10-29 | Disposition: A | Discharge status: Home / Self Care | Payer: Self-pay | Source: Ambulatory Visit | Attending: Pulmonary Disease | Admitting: Pulmonary Disease

## 2018-10-29 ENCOUNTER — Other Ambulatory Visit: Payer: Self-pay

## 2018-10-29 DIAGNOSIS — J449 Chronic obstructive pulmonary disease, unspecified: Secondary | ICD-10-CM

## 2018-10-29 NOTE — Progress Notes (Signed)
Order placed for outside Chest xray to be uploaded into PACS. Nothing further is needed at this time.

## 2018-11-03 ENCOUNTER — Ambulatory Visit: Payer: Medicare Other | Admitting: Family Medicine

## 2018-11-10 ENCOUNTER — Telehealth: Payer: Self-pay | Admitting: Pulmonary Disease

## 2018-11-10 NOTE — Telephone Encounter (Signed)
Returned call to canopy, informed Marina Goodell to send disc back to facility. Nothing further is needed at this time.

## 2018-11-23 ENCOUNTER — Telehealth: Payer: Self-pay

## 2018-11-23 ENCOUNTER — Ambulatory Visit (INDEPENDENT_AMBULATORY_CARE_PROVIDER_SITE_OTHER): Payer: Medicare Other | Admitting: Family Medicine

## 2018-11-23 ENCOUNTER — Encounter: Payer: Self-pay | Admitting: Family Medicine

## 2018-11-23 VITALS — BP 102/67 | HR 107 | Temp 98.2°F | Resp 17 | Ht 61.0 in | Wt 193.6 lb

## 2018-11-23 DIAGNOSIS — Z6836 Body mass index (BMI) 36.0-36.9, adult: Secondary | ICD-10-CM | POA: Diagnosis not present

## 2018-11-23 DIAGNOSIS — E669 Obesity, unspecified: Secondary | ICD-10-CM

## 2018-11-23 DIAGNOSIS — E1165 Type 2 diabetes mellitus with hyperglycemia: Secondary | ICD-10-CM

## 2018-11-23 DIAGNOSIS — Z6835 Body mass index (BMI) 35.0-35.9, adult: Secondary | ICD-10-CM

## 2018-11-23 DIAGNOSIS — Z794 Long term (current) use of insulin: Secondary | ICD-10-CM

## 2018-11-23 DIAGNOSIS — E119 Type 2 diabetes mellitus without complications: Secondary | ICD-10-CM

## 2018-11-23 LAB — GLUCOSE, POCT (MANUAL RESULT ENTRY): POC Glucose: 303 mg/dl — AB (ref 70–99)

## 2018-11-23 MED ORDER — LIRAGLUTIDE 18 MG/3ML ~~LOC~~ SOPN: 1.8000 mg | PEN_INJECTOR | Freq: Every day | SUBCUTANEOUS | 3 refills | Status: DC

## 2018-11-23 MED ORDER — GUAIFENESIN ER 600 MG PO TB12: 1200.0000 mg | ORAL_TABLET | Freq: Two times a day (BID) | ORAL | 3 refills | Status: DC | PRN

## 2018-11-23 MED ORDER — BLOOD GLUCOSE METER KIT: PACK | 0 refills | Status: DC

## 2018-11-23 MED ORDER — BD PEN NEEDLE MINI U/F 31G X 5 MM MISC: 3 refills | Status: DC

## 2018-11-23 MED ORDER — INSULIN DEGLUDEC 100 UNIT/ML ~~LOC~~ SOPN: 20.0000 [IU] | PEN_INJECTOR | Freq: Every day | SUBCUTANEOUS | 3 refills | Status: DC

## 2018-11-23 MED ORDER — DAPAGLIFLOZIN PROPANEDIOL 10 MG PO TABS: 10.0000 mg | ORAL_TABLET | Freq: Every day | ORAL | 2 refills | Status: DC

## 2018-11-23 MED ORDER — CETIRIZINE HCL 10 MG PO TABS: 10.0000 mg | ORAL_TABLET | Freq: Every day | ORAL | 3 refills | Status: DC

## 2018-11-23 NOTE — Progress Notes (Signed)
Patient ID: Alisha Roth, female    DOB: 1956-07-20, 63 y.o.   MRN: [id number]  PCP: [name], FNP  Chief Complaint  Patient presents with  . Diabetes    Subjective:  HPI Alisha Roth is a 63 y.o. female presents for diabetes follow-up. During last visit, patient complained of elevated blood sugar since [place] had been discontinued. She was prescribed Januvia, continued on Victoza, and continued on Farxiga. Today she reports that she was unable to obtain Victoza from pharmacy as they required a prior authorization. Nothing has been received in the office to date.  She reports the only thing she has been taking at home is [place] "she thinks". She did bring her medications to office today and she does not have [place]  in the bag. Again there is some discrepancies  there has been some discrepancy as to whether or not patient is actually taking this medication. Patient was on KETO diet up until early February and then stopped. She endorses indulging in high carbohydrates foods since stopping diet. She is mostly checking sugars in the morning and all have been >200. She denies weakness, and chest pain. Social History   Socioeconomic History  . Marital status: Single    Spouse name: Not on file  . Number of children: Not on file  . Years of education: Not on file  . Highest education level: Not on file  Occupational History  . Not on file  Social Needs  . Financial resource strain: Not on file  . Food insecurity:    Worry: Not on file    Inability: Not on file  . Transportation needs:    Medical: Not on file    Non-medical: Not on file  Tobacco Use  . Smoking status: Former Research scientist (life sciences)  . Smokeless tobacco: Never Used  . Tobacco comment: quit 2016  Substance and Sexual Activity  . Alcohol use: Never    Frequency: Never  . Drug use: Never  . Sexual activity: Not on file  Lifestyle  . Physical activity:    Days per week: Not on file    Minutes per session:  Not on file  . Stress: Not on file  Relationships  . Social connections:    Talks on phone: Not on file    Gets together: Not on file    Attends religious service: Not on file    Active member of club or organization: Not on file    Attends meetings of clubs or organizations: Not on file    Relationship status: Not on file  . Intimate partner violence:    Fear of current or ex partner: Not on file    Emotionally abused: Not on file    Physically abused: Not on file    Forced sexual activity: Not on file  Other Topics Concern  . Not on file  Social History Narrative  . Not on file    Family History  Problem Relation Age of Onset  . Diabetes Mother   . Alcohol abuse Father   . Diabetes Sister   . Alcohol abuse Brother    Review of Systems Pertinent negatives listed in HPI Patient Active Problem List   Diagnosis Date Noted  . Stage 2 moderate COPD by GOLD classification ([place]) 10/26/2018  . SHOULDER PAIN, LEFT 07/13/2009  . TOBACCO ABUSE 12/26/2008  . SCABIES 09/01/2008  . 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 HEADACHE 10/20/2007  . DEPRESSION 05/25/2007  . ASTHMA 05/25/2007  . MENOPAUSAL  SYNDROME 05/25/2007  . PLANTAR FASCITIS 12/04/2005  . ALLERGIC RHINITIS 11/20/2005    Allergies  Allergen Reactions  . Metformin And Related Diarrhea    Prior to Admission medications   Medication Sig Start Date End Date Taking? Authorizing Provider  albuterol (PROVENTIL HFA;VENTOLIN HFA) 108 (90 Base) MCG/ACT inhaler Inhale 2 puffs into the lungs every 6 (six) hours as needed for wheezing. 10/26/18  Yes [name], NP  albuterol (PROVENTIL) (2.5 MG/3ML) 0.083% nebulizer solution Take 3 mLs (2.5 mg total) by nebulization every 6 (six) hours as needed for wheezing or shortness of breath. 10/26/18  Yes [name], NP  B-D UF III MINI PEN NEEDLES 31G X 5 MM MISC  11/02/18  Yes [provider]  budesonide-formoterol (SYMBICORT) 160-4.5 MCG/ACT inhaler Inhale 2 puffs  into the lungs 2 (two) times daily. 10/26/18  Yes [name], NP  busPIRone (BUSPAR) 10 MG tablet Take 10 mg by mouth 2 (two) times daily as needed.   Yes [provider]  cetirizine (ZYRTEC) 10 MG tablet Take by mouth.   Yes [provider]  cyclobenzaprine (FLEXERIL) 10 MG tablet Take 10 mg by mouth at bedtime.   Yes [provider]  dapagliflozin propanediol (FARXIGA) 10 MG TABS tablet Take 10 mg by mouth daily. Pt takes at bedtime   Yes [provider]  diclofenac sodium (VOLTAREN) 1 % GEL Apply 4 g topically 4 (four) times daily. 10/22/18  Yes [name], FNP  DULoxetine (CYMBALTA) 60 MG capsule Take 1 capsule by mouth 2 (two) times daily. 09/28/18  Yes [provider]  liraglutide (VICTOZA) 18 MG/3ML SOPN Inject 1.8 mg into the skin. Takes in daily morning    Yes [provider]  lisinopril (PRINIVIL,ZESTRIL) 10 MG tablet Take by mouth. Take daily    Yes [provider]  meloxicam (MOBIC) 15 MG tablet Take 1 tablet (15 mg total) by mouth daily. 10/22/18  Yes [name], FNP  montelukast (SINGULAIR) 10 MG tablet Take by mouth.   Yes [provider]  omeprazole (PRILOSEC) 40 MG capsule Take by mouth.   Yes [provider]  QUEtiapine (SEROQUEL XR) 300 MG 24 hr tablet Take 600 mg by mouth at bedtime.    Yes [provider]  sitaGLIPtin (JANUVIA) 50 MG tablet Take 1 tablet (50 mg total) by mouth daily. 10/22/18  Yes [name], FNP  SPIRIVA HANDIHALER 18 MCG inhalation capsule Place 1 capsule (18 mcg total) into inhaler and inhale daily. 10/26/18  Yes [name], NP    Past Medical, Surgical Family and Social History reviewed and updated.    Objective:   Today's Vitals   11/23/18 0847  BP: 92/61  Pulse: (!) 107  Resp: 17  Temp: 98.2 F (36.8 C)  TempSrc: Oral  SpO2: 93%  Weight: 193 lb 9.6 oz (87.8 kg)  Height: '5\' 1"'  (1.549 m)    Wt Readings from Last 3  Encounters:  11/23/18 193 lb 9.6 oz (87.8 kg)  10/26/18 201 lb (91.2 kg)  10/22/18 206 lb (93.4 kg)    Physical Exam General appearance: Patient appears very drowsy during visit  Respiratory: Respirations even and unlabored, normal respiratory rate Heart: Rate and rhythm normal. No gallop or murmurs noted on exam  Extremities: No gross deformities Skin: Skin color, texture, turgor normal. No rashes seen  Psych: Appropriate mood and affect. Neurologic: Mental status: Alert, oriented to person, place, and time, thought content appropriate. Lab Results  Component Value Date   POCGLU 303 (A) 11/23/2018   POCGLU 238 (A) 10/22/2018   POCGLU 125 (A) 10/05/2018    Lab Results  Component Value Date   HGBA1C 6.8 (H) 10/05/2018    Assessment & Plan:  1. Type 2 diabetes mellitus without complication, with long-term current use of insulin (HCC) - Glucose (CBG), 303 today.  Home readings high 100-250 consistently from readings provided within meter  Did not treat as patient is going to pick-up medications as soon as she leave the office. She is accompanied by driver. She is very drowsiness today. Upon review of her medications, she is taking several sedative medications. Given limited time frame of visit unable to address polypharmacy today.  Diabetes medication revised as follows: -Start Tresiba 20 units daily -Continue Victoza 1.8 mg -Continue Farxiga -Discontinued Januvia (Vicotza not covered with Januvia)  -Discussed diet and in detail and encouraged to select complex carbohydrate   2. Class 2 obesity with body mass index (BMI) of 35.0 to 35.9 in adult, unspecified obesity type, unspecified whether serious comorbidity present Encouraged efforts to reduce weight include engaging in physical activity as tolerated with goal of 150 minutes per week. Improve dietary choices and eat a meal regimen consistent with a Mediterranean or DASH diet. Reduce simple carbohydrates. Do not skip meals and  eat healthy snacks throughout the day to avoid over-eating at dinner. Set a goal weight loss that is achievable for you.   Meds ordered this encounter  Medications  . DISCONTD: liraglutide (VICTOZA) 18 MG/3ML SOPN    Sig: Inject 0.3 mLs (1.8 mg total) into the skin daily. Takes in daily morning    Dispense:  3 mL    Refill:  3  . insulin degludec (TRESIBA FLEXTOUCH) 100 UNIT/ML SOPN FlexTouch Pen    Sig: Inject 0.2 mLs (20 Units total) into the skin daily.    Dispense:  3 mL    Refill:  3  . B-D UF III MINI PEN NEEDLES 31G X 5 MM MISC    Sig: Use 1 pen to inject insulin as prescribed daily.  E11.9 Type 2 diabetes    Dispense:  200 each    Refill:  3  . blood glucose meter kit and supplies    Sig: Dispense based on patient and insurance preference. Use up to four times daily as directed. (FOR ICD-10 E10.9, E11.9).    Dispense:  1 each    Refill:  0    Order Specific Question:   Number of strips    Answer:   200    Order Specific Question:   Number of lancets    Answer:   200  . cetirizine (ZYRTEC) 10 MG tablet    Sig: Take 1 tablet (10 mg total) by mouth daily.    Dispense:  60 tablet    Refill:  3  . guaiFENesin (MUCINEX) 600 MG 12 hr tablet    Sig: Take 2 tablets (1,200 mg total) by mouth 2 (two) times daily as needed.    Dispense:  28 tablet    Refill:  3  . liraglutide (VICTOZA) 18 MG/3ML SOPN    Sig: Inject 0.3 mLs (1.8 mg total) into the skin daily. Takes in daily morning    Dispense:  3 mL    Refill:  3    Please refill Januvia discontinued     -The patient was given clear instructions to go to ER or return to medical center if symptoms do not improve, worsen  or new problems develop. The patient verbalized understanding.    Molli Barrows, FNP Primary Care at [facility] [address], [place] [phone]: [phone]

## 2018-11-23 NOTE — Telephone Encounter (Signed)
Called [facility] pharmacy to see when patient last filled her Farxiga 10 mg tablet. Wilkie Aye states that patient last picked up Farxiga #30 on 10/22/2018. Rx had no additional refills. Let provider know & she states that she will send refills over for patient.

## 2018-11-23 NOTE — Patient Instructions (Addendum)
Changes made to medications today include the following discontinue Januvia as insurance would not cover Januvia and Victoza. Resume Victoza daily. I have added [place] to your diabetes regimen.  You will inject Tresiba 20 units daily at bedtime.  Ensure that you eat a small snack to avoid hypoglycemia. Return in 6 weeks for an A1c check.  I have also prescribed guaifenesin to help with chest congestion.    Diabetes Basics  Diabetes (diabetes mellitus) is a long-term (chronic) disease. It occurs when the body does not properly use sugar (glucose) that is released from food after you eat. Diabetes may be caused by one or both of these problems:  Your pancreas does not make enough of a hormone called insulin.  Your body does not react in a normal way to insulin that it makes. Insulin lets sugars (glucose) go into cells in your body. This gives you energy. If you have diabetes, sugars cannot get into cells. This causes high blood sugar (hyperglycemia). Follow these instructions at home: How is diabetes treated? You may need to take insulin or other diabetes medicines daily to keep your blood sugar in balance. Take your diabetes medicines every day as told by your doctor. List your diabetes medicines here: Diabetes medicines  Name of medicine: ______________________________ ? Amount (dose): _______________ Time (a.m./p.m.): _______________ Notes: ___________________________________  Name of medicine: ______________________________ ? Amount (dose): _______________ Time (a.m./p.m.): _______________ Notes: ___________________________________  Name of medicine: ______________________________ ? Amount (dose): _______________ Time (a.m./p.m.): _______________ Notes: ___________________________________ If you use insulin, you will learn how to give yourself insulin by injection. You may need to adjust the amount based on the food that you eat. List the types of insulin you use  here: Insulin  Insulin type: ______________________________ ? Amount (dose): _______________ Time (a.m./p.m.): _______________ Notes: ___________________________________  Insulin type: ______________________________ ? Amount (dose): _______________ Time (a.m./p.m.): _______________ Notes: ___________________________________  Insulin type: ______________________________ ? Amount (dose): _______________ Time (a.m./p.m.): _______________ Notes: ___________________________________  Insulin type: ______________________________ ? Amount (dose): _______________ Time (a.m./p.m.): _______________ Notes: ___________________________________  Insulin type: ______________________________ ? Amount (dose): _______________ Time (a.m./p.m.): _______________ Notes: ___________________________________ How do I manage my blood sugar?  Check your blood sugar levels using a blood glucose monitor as directed by your doctor. Your doctor will set treatment goals for you. Generally, you should have these blood sugar levels:  Before meals (preprandial): 80-130 mg/dL (9.1-4.7 mmol/L).  After meals (postprandial): below 180 mg/dL (10 mmol/L).  A1c level: less than 7%. Write down the times that you will check your blood sugar levels: Blood sugar checks  Time: _______________ Notes: ___________________________________  Time: _______________ Notes: ___________________________________  Time: _______________ Notes: ___________________________________  Time: _______________ Notes: ___________________________________  Time: _______________ Notes: ___________________________________  Time: _______________ Notes: ___________________________________  What do I need to know about low blood sugar? Low blood sugar is called hypoglycemia. This is when blood sugar is at or below 70 mg/dL (3.9 mmol/L). Symptoms may include:  Feeling: ? Hungry. ? Worried or nervous (anxious). ? Sweaty and  clammy. ? Confused. ? Dizzy. ? Sleepy. ? Sick to your stomach (nauseous).  Having: ? A fast heartbeat. ? A headache. ? A change in your vision. ? Tingling or no feeling (numbness) around the mouth, lips, or tongue. ? Jerky movements that you cannot control (seizure).  Having trouble with: ? Moving (coordination). ? Sleeping. ? Passing out (fainting). ? Getting upset easily (irritability). Treating low blood sugar To treat low blood sugar, eat or drink something sugary right away. If you can think clearly and  swallow safely, follow the 15:15 rule:  Take 15 grams of a fast-acting carb (carbohydrate). Talk with your doctor about how much you should take.  Some fast-acting carbs are: ? Sugar tablets (glucose pills). Take 3-4 glucose pills. ? 6-8 pieces of hard candy. ? 4-6 oz (120-150 mL) of fruit juice. ? 4-6 oz (120-150 mL) of regular (not diet) soda. ? 1 Tbsp (15 mL) honey or sugar.  Check your blood sugar 15 minutes after you take the carb.  If your blood sugar is still at or below 70 mg/dL (3.9 mmol/L), take 15 grams of a carb again.  If your blood sugar does not go above 70 mg/dL (3.9 mmol/L) after 3 tries, get help right away.  After your blood sugar goes back to normal, eat a meal or a snack within 1 hour. Treating very low blood sugar If your blood sugar is at or below 54 mg/dL (3 mmol/L), you have very low blood sugar (severe hypoglycemia). This is an emergency. Do not wait to see if the symptoms will go away. Get medical help right away. Call your local emergency services (911 in the [place] Do not drive yourself to the hospital. Questions to ask your health care provider  Do I need to meet with a diabetes educator?  What equipment will I need to care for myself at home?  What diabetes medicines do I need? When should I take them?  How often do I need to check my blood sugar?  What number can I call if I have questions?  When is my next doctor's  visit?  Where can I find a support group for people with diabetes? Where to find more information  American Diabetes Association: www.diabetes.org  American Association of Diabetes Educators: www.diabeteseducator.org/patient-resources Contact a doctor if:  Your blood sugar is at or above 240 mg/dL (16.1 mmol/L) for 2 days in a row.  You have been sick or have had a fever for 2 days or more, and you are not getting better.  You have any of these problems for more than 6 hours: ? You cannot eat or drink. ? You feel sick to your stomach (nauseous). ? You throw up (vomit). ? You have watery poop (diarrhea). Get help right away if:  Your blood sugar is lower than 54 mg/dL (3 mmol/L).  You get confused.  You have trouble: ? Thinking clearly. ? Breathing. Summary  Diabetes (diabetes mellitus) is a long-term (chronic) disease. It occurs when the body does not properly use sugar (glucose) that is released from food after digestion.  Take insulin and diabetes medicines as told.  Check your blood sugar every day, as often as told.  Keep all follow-up visits as told by your doctor. This is important. This information is not intended to replace advice given to you by your health care provider. Make sure you discuss any questions you have with your health care provider. Document Released: 12/05/2017 Document Revised: 02/23/2018 Document Reviewed: 12/05/2017 Elsevier Interactive Patient Education  2019 [employer].

## 2018-11-23 NOTE — Telephone Encounter (Signed)
Paperwork received on 11/23/2018  Type of paperwork: SCAT paperwork B  Route received: Patient left at office   Has patient completed their portion of the paperwork: yes   Has office staff updated demographics of paperwork: yes   Paperwork routed to provider : yes

## 2018-11-23 NOTE — Addendum Note (Signed)
Addended by: Bing Neighbors on: 11/23/2018 11:03 AM   Modules accepted: Orders

## 2018-11-24 NOTE — Telephone Encounter (Signed)
SCAT form completed. Notify patient ok to pick-up at front desk

## 2018-11-24 NOTE — Telephone Encounter (Signed)
Called several times, left voicemail. Will attempt again later this afternoon.

## 2018-11-30 ENCOUNTER — Ambulatory Visit: Payer: Medicare Other | Admitting: Pulmonary Disease

## 2018-12-02 ENCOUNTER — Ambulatory Visit: Payer: Medicare Other | Admitting: Pulmonary Disease

## 2018-12-21 ENCOUNTER — Ambulatory Visit
Admission: EM | Admit: 2018-12-21 | Discharge: 2018-12-21 | Disposition: A | Discharge status: Home / Self Care | Payer: Medicare Other | Attending: Family Medicine | Admitting: Family Medicine

## 2018-12-21 ENCOUNTER — Other Ambulatory Visit: Payer: Self-pay

## 2018-12-21 ENCOUNTER — Encounter: Payer: Self-pay | Admitting: Family Medicine

## 2018-12-21 DIAGNOSIS — R490 Dysphonia: Secondary | ICD-10-CM | POA: Insufficient documentation

## 2018-12-21 DIAGNOSIS — J029 Acute pharyngitis, unspecified: Secondary | ICD-10-CM | POA: Diagnosis present

## 2018-12-21 LAB — POCT RAPID STREP A (OFFICE): Rapid Strep A Screen: NEGATIVE

## 2018-12-21 MED ORDER — FLUCONAZOLE 150 MG PO TABS: 150.0000 mg | ORAL_TABLET | Freq: Once | ORAL | 0 refills | Status: AC

## 2018-12-21 MED ORDER — CHLORHEXIDINE GLUCONATE 0.12 % MT SOLN: 15.0000 mL | Freq: Two times a day (BID) | OROMUCOSAL | 0 refills | Status: DC

## 2018-12-21 MED ORDER — HYDROCODONE-ACETAMINOPHEN 5-325 MG PO TABS: 1.0000 | ORAL_TABLET | Freq: Four times a day (QID) | ORAL | 0 refills | Status: DC | PRN

## 2018-12-21 NOTE — ED Triage Notes (Signed)
Pt c/o sore throat and cough x3 wks, states now worse to swallow

## 2018-12-21 NOTE — ED Provider Notes (Signed)
[name]    CSN: [id number] Arrival date & time: 12/21/18  1823     History   Chief Complaint Chief Complaint  Patient presents with  . Sore Throat    HPI Alisha Roth is a 63 y.o. female.   63 yo woman making her initial visit to [facility], is a patient of [name], NP and has problems of diabetes, COPD and obesity.  She is complaining of sore throat and hoarseness.  She's tried OTC's including ibuprofen. She also took two antibiotic pills she had lying around. The discomfort is limited to the throat. The pain is severe and keeping her awake.  No cough or fever.  She states that the diabetes has been controlled.       Past Medical History:  Diagnosis Date  . Asthma   . COPD (chronic obstructive pulmonary disease) ([place])   . DM (diabetes mellitus) ([place])   . Former smoker Quit 2016  . Obesity   . Seasonal allergies     Patient Active Problem List   Diagnosis Date Noted  . Stage 2 moderate COPD by GOLD classification ([place]) 10/26/2018  . SHOULDER PAIN, LEFT 07/13/2009  . TOBACCO ABUSE 12/26/2008  . SCABIES 09/01/2008  . 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 HEADACHE 10/20/2007  . DEPRESSION 05/25/2007  . ASTHMA 05/25/2007  . MENOPAUSAL SYNDROME 05/25/2007  . PLANTAR FASCITIS 12/04/2005  . ALLERGIC RHINITIS 11/20/2005    Past Surgical History:  Procedure Laterality Date  . ABDOMINAL HYSTERECTOMY    . CESAREAN SECTION    . LAPAROSCOPIC CHOLECYSTECTOMY      OB History   No obstetric history on file.      Home Medications    Prior to Admission medications   Medication Sig Start Date End Date Taking? Authorizing Provider  albuterol (PROVENTIL HFA;VENTOLIN HFA) 108 (90 Base) MCG/ACT inhaler Inhale 2 puffs into the lungs every 6 (six) hours as needed for wheezing. 10/26/18   [name], NP  albuterol (PROVENTIL) (2.5 MG/3ML) 0.083% nebulizer solution Take 3 mLs (2.5 mg total) by nebulization every 6 (six) hours as needed for wheezing or  shortness of breath. 10/26/18   [name], NP  B-D UF III MINI PEN NEEDLES 31G X 5 MM MISC Use 1 pen to inject insulin as prescribed daily.  E11.9 Type 2 diabetes 11/23/18   [name], FNP  blood glucose meter kit and supplies Dispense based on patient and insurance preference. Use up to four times daily as directed. (FOR ICD-10 E10.9, E11.9). 11/23/18   [name], FNP  budesonide-formoterol [facility]) 160-4.5 MCG/ACT inhaler Inhale 2 puffs into the lungs 2 (two) times daily. 10/26/18   [name], NP  busPIRone (BUSPAR) 10 MG tablet Take 10 mg by mouth 2 (two) times daily as needed.    [provider]  cetirizine (ZYRTEC) 10 MG tablet Take 1 tablet (10 mg total) by mouth daily. 11/23/18   [name], FNP  chlorhexidine (PERIDEX) 0.12 % solution Use as directed 15 mLs in the mouth or throat 2 (two) times daily. 12/21/18   Robyn Haber, MD  cyclobenzaprine (FLEXERIL) 10 MG tablet Take 10 mg by mouth at bedtime.    [provider]  dapagliflozin propanediol (FARXIGA) 10 MG TABS tablet Take 10 mg by mouth daily. Pt takes at bedtime 11/23/18   [name], FNP  diclofenac sodium (VOLTAREN) 1 % GEL Apply 4 g topically 4 (four) times daily. 10/22/18   [name], FNP  DULoxetine (CYMBALTA) 60 MG capsule Take 1 capsule by mouth 2 (two) times daily. 09/28/18   [provider]  fluconazole (DIFLUCAN) 150 MG tablet Take 1 tablet (150 mg total) by mouth once for 1 dose. Repeat if needed 12/21/18 12/21/18  Robyn Haber, MD  gabapentin (NEURONTIN) 600 MG tablet Take 600 mg by mouth at bedtime.    [provider]  guaiFENesin (MUCINEX) 600 MG 12 hr tablet Take 2 tablets (1,200 mg total) by mouth 2 (two) times daily as needed. 11/23/18   [name], FNP  HYDROcodone-acetaminophen (NORCO) 5-325 MG tablet Take 1 tablet by mouth every 6 (six) hours as needed for moderate pain. 12/21/18   Robyn Haber, MD  insulin degludec  (TRESIBA FLEXTOUCH) 100 UNIT/ML SOPN FlexTouch Pen Inject 0.2 mLs (20 Units total) into the skin daily. 11/23/18   [name], FNP  liraglutide (VICTOZA) 18 MG/3ML SOPN Inject 0.3 mLs (1.8 mg total) into the skin daily. Takes in daily morning 11/23/18   [name], FNP  lisinopril (PRINIVIL,ZESTRIL) 10 MG tablet Take by mouth. Take daily     [provider]  LORazepam (ATIVAN) 0.5 MG tablet Take 0.5 mg by mouth every 8 (eight) hours as needed for anxiety. PCP will not provide refills.    [provider]  meloxicam (MOBIC) 15 MG tablet Take 1 tablet (15 mg total) by mouth daily. 10/22/18   [name], FNP  montelukast (SINGULAIR) 10 MG tablet Take by mouth.    [provider]  omeprazole (PRILOSEC) 40 MG capsule Take by mouth.    [provider]  QUEtiapine (SEROQUEL XR) 300 MG 24 hr tablet Take 600 mg by mouth at bedtime.     [provider]  SPIRIVA HANDIHALER 18 MCG inhalation capsule Place 1 capsule (18 mcg total) into inhaler and inhale daily. 10/26/18   [name], NP    Family History Family History  Problem Relation Age of Onset  . Diabetes Mother   . Alcohol abuse Father   . Diabetes Sister   . Alcohol abuse Brother     Social History Social History   Tobacco Use  . Smoking status: Former Research scientist (life sciences)  . Smokeless tobacco: Never Used  . Tobacco comment: quit 2016  Substance Use Topics  . Alcohol use: Never    Frequency: Never  . Drug use: Never     Allergies   Metformin and related   Review of Systems Review of Systems  Constitutional: Negative.   HENT: Positive for sore throat.   Respiratory: Negative.   All other systems reviewed and are negative.    Physical Exam Triage Vital Signs ED Triage Vitals  Enc Vitals Group     BP      Pulse      Resp      Temp      Temp src      SpO2      Weight      Height      Head Circumference      Peak Flow      Pain Score      Pain Loc      Pain  Edu?      Excl. in [place]?    No data found.  Updated Vital Signs BP (!) 158/73 (BP Location: Left Arm)   Pulse (!) 108   Temp 98.5 F (36.9 C) (Oral)   Resp 18   SpO2 96%    Physical Exam Vitals signs  and nursing note reviewed.  Constitutional:      Appearance: She is well-developed. She is obese.  HENT:     Head: Normocephalic.     Ears:     Comments: Hearing aids in place    Mouth/Throat:     Mouth: Mucous membranes are dry.     Pharynx: Uvula midline. Posterior oropharyngeal erythema present. No oropharyngeal exudate.     Tonsils: No tonsillar exudate or tonsillar abscesses.  Eyes:     Conjunctiva/sclera: Conjunctivae normal.  Neck:     Musculoskeletal: Normal range of motion and neck supple.  Cardiovascular:     Rate and Rhythm: Normal rate and regular rhythm.     Heart sounds: Normal heart sounds.  Pulmonary:     Effort: Pulmonary effort is normal.     Breath sounds: Normal breath sounds.  Skin:    General: Skin is warm and dry.  Neurological:     General: No focal deficit present.     Mental Status: She is alert and oriented to person, place, and time.  Psychiatric:        Mood and Affect: Mood normal.        Behavior: Behavior normal.      UC Treatments / Results  Labs (all labs ordered are listed, but only abnormal results are displayed) Labs Reviewed  POCT RAPID STREP A (OFFICE) - Normal  CULTURE, GROUP A STREP [facility])    EKG None  Radiology No results found.  Procedures Procedures (including critical care time)  Medications Ordered in UC Medications - No data to display  Initial Impression / Assessment and Plan / UC Course  I have reviewed the triage vital signs and the nursing notes.  Pertinent labs & imaging results that were available during my care of the patient were reviewed by me and considered in my medical decision making (see chart for details).    Final Clinical Impressions(s) / UC Diagnoses   Final diagnoses:  Acute  pharyngitis, unspecified etiology  Hoarseness     Discharge Instructions     I believe this is thrush, which is caused by the inhalers and diabetes.  You should be feeling much better by Wednesday after starting these medications tonight.    ED Prescriptions    Medication Sig Dispense Auth. Provider   fluconazole (DIFLUCAN) 150 MG tablet Take 1 tablet (150 mg total) by mouth once for 1 dose. Repeat if needed 2 tablet Robyn Haber, MD   HYDROcodone-acetaminophen (NORCO) 5-325 MG tablet Take 1 tablet by mouth every 6 (six) hours as needed for moderate pain. 12 tablet Robyn Haber, MD   chlorhexidine (PERIDEX) 0.12 % solution Use as directed 15 mLs in the mouth or throat 2 (two) times daily. 120 mL Robyn Haber, MD     Controlled Substance Prescriptions [place] Controlled Substance Registry consulted? Yes, I have consulted the [place] Controlled Substances Registry for this patient, and feel the risk/benefit ratio today is favorable for proceeding with this prescription for a controlled substance.   Robyn Haber, MD 12/21/18 [id number]

## 2018-12-21 NOTE — Discharge Instructions (Addendum)
I believe this is thrush, which is caused by the inhalers and diabetes.  You should be feeling much better by Wednesday after starting these medications tonight.

## 2018-12-25 LAB — CULTURE, GROUP A STREP (THRC)

## 2018-12-28 ENCOUNTER — Telehealth: Payer: Self-pay | Admitting: Family Medicine

## 2018-12-28 NOTE — Telephone Encounter (Signed)
Patient advised to return to urgent care for re-treatment. At her appointment on 01/04/2019 with PCP we can see how her blood sugars are to see if the recurring thrush is due to her diabetes.

## 2018-12-28 NOTE — Telephone Encounter (Signed)
Patient called because she went to urgent care and was told that she has thrush, patient states that she is experiencing the same symptoms, hoarseness, and would like to know what she should do, please follow up.

## 2019-01-04 ENCOUNTER — Encounter: Payer: Self-pay | Admitting: Family Medicine

## 2019-01-04 ENCOUNTER — Other Ambulatory Visit: Payer: Self-pay

## 2019-01-04 ENCOUNTER — Telehealth: Payer: Self-pay

## 2019-01-04 ENCOUNTER — Ambulatory Visit (INDEPENDENT_AMBULATORY_CARE_PROVIDER_SITE_OTHER): Payer: Medicare Other | Admitting: Family Medicine

## 2019-01-04 DIAGNOSIS — E119 Type 2 diabetes mellitus without complications: Secondary | ICD-10-CM

## 2019-01-04 DIAGNOSIS — Z794 Long term (current) use of insulin: Secondary | ICD-10-CM

## 2019-01-04 DIAGNOSIS — J449 Chronic obstructive pulmonary disease, unspecified: Secondary | ICD-10-CM | POA: Diagnosis not present

## 2019-01-04 DIAGNOSIS — F329 Major depressive disorder, single episode, unspecified: Secondary | ICD-10-CM

## 2019-01-04 DIAGNOSIS — F32A Depression, unspecified: Secondary | ICD-10-CM

## 2019-01-04 NOTE — Telephone Encounter (Signed)
Patient called back with the contact information for her letter request. Letter needs to be faxed to   [facility]. Ph. [phone] Fax: [phone]

## 2019-01-04 NOTE — Progress Notes (Signed)
Virtual Visit via Telephone Note  I connected with Alisha Roth on 01/04/19 at 10:10 AM EDT by telephone and verified that I am speaking with the correct person using two identifiers.  Provider is located at primary care office today.   I discussed the limitations, risks, security and privacy concerns of performing an evaluation and management service by telephone and the availability of in person appointments. I also discussed with the patient that there may be a patient responsible charge related to this service. The patient expressed understanding and agreed to proceed.   History of Present Illness: Type 2 Diabetes: [place] monitors glucose at home. Adheres to current medication regimen which consists of [place] and Victoza. Current home readings have remained well controlled with average BS readings 120. Last A1C 6.8,  3 months prior.Engages in no routine exercise and although adheres to diabetes diet. Current regimen includes Denies hypoglycemia   Request for accomodation letter: Patient is moving out of state June 1st and will need a letter to allow her two companion dogs to reside with her. She suffers from depression and she reports that her animals are therapeutic. She is also requesting for letter to include accommodations for her apartment to be equipped with a ceiling fan Roth to COPD. She reports when air not circulating in her bedroom, she experiences difficulty breathing.   Assessment and Plan: 1. Type 2 diabetes mellitus without complication, with long-term current use of insulin (HCC) -Controlled. Explained of the importance of establishing with a PCP within the city she is relocating to.  -Agreed to refill any medications needed for an additional 90 days to allow time her patient to find a PCP. She will contact pharmacy to request refills.  2. Chronic obstructive pulmonary disease, unspecified COPD type (HCC) -Agreed to provide letter recommending ceiling fan -Also encouraged  patient to increase adherence with maintenance inhaler therapy.  3. Depression, unspecified depression type - Agreed to provide letter for companion dogs   Follow Up Instructions: No follow-up on file. Patient is moving out of state in 5-6 weeks.   I discussed the assessment and treatment plan with the patient. The patient was provided an opportunity to ask questions and all were answered. The patient agreed with the plan and demonstrated an understanding of the instructions.   The patient was advised to call back or seek an in-person evaluation if the symptoms worsen or if the condition fails to improve as anticipated.  I provided 15 minutes of non-face-to-face time during this encounter.   Joaquin Courts, FNP

## 2019-01-04 NOTE — Progress Notes (Deleted)
Called patient to initiate their telephone visit with provider Joaquin Courts, FNP-C. Verified date of birth. States that her FSBS have been running in the 120s fasting. Patient is requesting a letter for her support/companion dog. KWalker, CMA.

## 2019-01-06 ENCOUNTER — Ambulatory Visit: Payer: Medicare Other | Admitting: Pulmonary Disease

## 2019-01-06 NOTE — Telephone Encounter (Signed)
Letter was faxed, provider was given confirmation.

## 2019-01-06 NOTE — Telephone Encounter (Signed)
Please fax to  [facility]. Ph. [phone] Fax: [phone]  Please return confirmation to provider.   Thanks !  Joaquin Courts, FNP

## 2019-02-11 ENCOUNTER — Telehealth: Payer: Self-pay | Admitting: Family Medicine

## 2019-02-11 NOTE — Telephone Encounter (Signed)
Caller Name: Ieasha Hnat    Reason for Call:  Medication refill for ACCU-CHEK GUIDE test strip [[id number]]   If this is a medication request: confirm pharmacy  [id number] [place], [place] - [address]  Verify call back number:  [phone]  Action taken by recipient of request:

## 2019-02-12 MED ORDER — ACCU-CHEK GUIDE VI STRP: ORAL_STRIP | 1 refills | Status: DC

## 2019-02-12 NOTE — Telephone Encounter (Signed)
Rx sent 

## 2019-03-31 ENCOUNTER — Other Ambulatory Visit: Payer: Self-pay

## 2019-03-31 ENCOUNTER — Emergency Department (HOSPITAL_COMMUNITY): Payer: Medicare Other

## 2019-03-31 ENCOUNTER — Ambulatory Visit: Admission: EM | Admit: 2019-03-31 | Discharge: 2019-03-31 | Payer: Medicare Other | Source: Home / Self Care

## 2019-03-31 ENCOUNTER — Emergency Department (HOSPITAL_COMMUNITY)
Admission: EM | Admit: 2019-03-31 | Discharge: 2019-03-31 | Disposition: A | Discharge status: Home / Self Care | Payer: Medicare Other | Attending: Emergency Medicine | Admitting: Emergency Medicine

## 2019-03-31 DIAGNOSIS — Z87891 Personal history of nicotine dependence: Secondary | ICD-10-CM | POA: Diagnosis not present

## 2019-03-31 DIAGNOSIS — Z794 Long term (current) use of insulin: Secondary | ICD-10-CM | POA: Insufficient documentation

## 2019-03-31 DIAGNOSIS — N390 Urinary tract infection, site not specified: Secondary | ICD-10-CM | POA: Diagnosis not present

## 2019-03-31 DIAGNOSIS — R531 Weakness: Secondary | ICD-10-CM | POA: Diagnosis present

## 2019-03-31 DIAGNOSIS — Z79899 Other long term (current) drug therapy: Secondary | ICD-10-CM | POA: Insufficient documentation

## 2019-03-31 DIAGNOSIS — J449 Chronic obstructive pulmonary disease, unspecified: Secondary | ICD-10-CM | POA: Diagnosis not present

## 2019-03-31 DIAGNOSIS — E119 Type 2 diabetes mellitus without complications: Secondary | ICD-10-CM | POA: Insufficient documentation

## 2019-03-31 DIAGNOSIS — R441 Visual hallucinations: Secondary | ICD-10-CM | POA: Diagnosis not present

## 2019-03-31 LAB — COMPREHENSIVE METABOLIC PANEL
ALT: 24 U/L (ref 0–44)
AST: 17 U/L (ref 15–41)
Albumin: 3.4 g/dL — ABNORMAL LOW (ref 3.5–5.0)
Alkaline Phosphatase: 59 U/L (ref 38–126)
Anion gap: 12 (ref 5–15)
BUN: 15 mg/dL (ref 8–23)
CO2: 24 mmol/L (ref 22–32)
Calcium: 9.1 mg/dL (ref 8.9–10.3)
Chloride: 103 mmol/L (ref 98–111)
Creatinine, Ser: 1 mg/dL (ref 0.44–1.00)
GFR calc Af Amer: >60 mL/min (ref >60)
GFR calc non Af Amer: 60 mL/min — ABNORMAL LOW (ref >60)
Glucose, Bld: 262 mg/dL — ABNORMAL HIGH (ref 70–99)
Potassium: 3.6 mmol/L (ref 3.5–5.1)
Sodium: 139 mmol/L (ref 135–145)
Total Bilirubin: 0.7 mg/dL (ref 0.3–1.2)
Total Protein: 6.2 g/dL — ABNORMAL LOW (ref 6.5–8.1)

## 2019-03-31 LAB — CBC WITH DIFFERENTIAL/PLATELET
Abs Immature Granulocytes: 0.04 10*3/uL (ref 0.00–0.07)
Basophils Absolute: 0 10*3/uL (ref 0.0–0.1)
Basophils Relative: 0 %
Eosinophils Absolute: 0.1 10*3/uL (ref 0.0–0.5)
Eosinophils Relative: 1 %
HCT: 42.4 % (ref 36.0–46.0)
Hemoglobin: 13.5 g/dL (ref 12.0–15.0)
Immature Granulocytes: 0 %
Lymphocytes Relative: 11 %
Lymphs Abs: 1.3 10*3/uL (ref 0.7–4.0)
MCH: 29.6 pg (ref 26.0–34.0)
MCHC: 31.8 g/dL (ref 30.0–36.0)
MCV: 93 fL (ref 80.0–100.0)
Monocytes Absolute: 0.5 10*3/uL (ref 0.1–1.0)
Monocytes Relative: 4 %
Neutro Abs: 9.8 10*3/uL — ABNORMAL HIGH (ref 1.7–7.7)
Neutrophils Relative %: 84 %
Platelets: 246 10*3/uL (ref 150–400)
RBC: 4.56 MIL/uL (ref 3.87–5.11)
RDW: 15 % (ref 11.5–15.5)
WBC: 11.8 10*3/uL — ABNORMAL HIGH (ref 4.0–10.5)
nRBC: 0 % (ref 0.0–0.2)

## 2019-03-31 LAB — URINALYSIS, ROUTINE W REFLEX MICROSCOPIC
Bilirubin Urine: NEGATIVE
Glucose, UA: NEGATIVE mg/dL
Hgb urine dipstick: NEGATIVE
Ketones, ur: 5 mg/dL — AB
Nitrite: NEGATIVE
Protein, ur: 30 mg/dL — AB
Specific Gravity, Urine: 1.026 (ref 1.005–1.030)
pH: 5 (ref 5.0–8.0)

## 2019-03-31 LAB — MAGNESIUM: Magnesium: 1.9 mg/dL (ref 1.7–2.4)

## 2019-03-31 LAB — TSH: TSH: 1.091 u[IU]/mL (ref 0.350–4.500)

## 2019-03-31 MED ORDER — CEPHALEXIN 250 MG PO CAPS
500.0000 mg | ORAL_CAPSULE | Freq: Once | ORAL | Status: AC
  Administered 2019-03-31: 500 mg via ORAL
  Filled 2019-03-31: qty 2

## 2019-03-31 MED ORDER — CEPHALEXIN 500 MG PO CAPS: 500.0000 mg | ORAL_CAPSULE | Freq: Two times a day (BID) | ORAL | 0 refills | Status: AC

## 2019-03-31 MED ORDER — BENZONATATE 100 MG PO CAPS: 100.0000 mg | ORAL_CAPSULE | Freq: Three times a day (TID) | ORAL | 0 refills | Status: DC

## 2019-03-31 NOTE — ED Provider Notes (Signed)
[facility] Provider Note   CSN: [id number] Arrival date & time: 03/31/19  1110    History   Chief Complaint Chief Complaint  Patient presents with  . Weakness    HPI Alisha Roth is a 63 y.o. female.     HPI  63 y/o female - PMH of DM and COPD, presents with episodes of weakness / visual hallucinations last night - took Seroquel at night - shortly therafter was feeling off / weak / tried walking but both legs buckled - stumbled - no head injury or LOC - crawled to a chair and tried to use phone but too weak - felt hot and thought she saw her daughter in the room (wasn't there) - turned on fan and cooled off - then she went to bed and felt back to normal - felt normal this morning - and at this time as well - concerned about what happened last night.  She had similar episode in past when taking Seroquel with tylenol PM.  Pt lives with GD at home.  No sx at this time.  She does endorse a couple of weeks of coughing and congestion but no fevers or chills and is not shortness of breath at this time  Past Medical History:  Diagnosis Date  . Asthma   . COPD (chronic obstructive pulmonary disease) (HCC)   . DM (diabetes mellitus) (HCC)   . Former smoker Quit 2016  . Obesity   . Seasonal allergies     Patient Active Problem List   Diagnosis Date Noted  . Stage 2 moderate COPD by GOLD classification (HCC) 10/26/2018  . SHOULDER PAIN, LEFT 07/13/2009  . TOBACCO ABUSE 12/26/2008  . SCABIES 09/01/2008  . 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 HEADACHE 10/20/2007  . DEPRESSION 05/25/2007  . ASTHMA 05/25/2007  . MENOPAUSAL SYNDROME 05/25/2007  . PLANTAR FASCITIS 12/04/2005  . ALLERGIC RHINITIS 11/20/2005    Past Surgical History:  Procedure Laterality Date  . ABDOMINAL HYSTERECTOMY    . CESAREAN SECTION    . LAPAROSCOPIC CHOLECYSTECTOMY       OB History   No obstetric history on file.      Home Medications    Prior to  Admission medications   Medication Sig Start Date End Date Taking? Authorizing Provider  Accu-Chek FastClix Lancets MISC TEST UPTO 4 TIMES A DAY 11/23/18   [provider]  ACCU-CHEK GUIDE test strip USE AS DIRECTED UP TO 4 TIMES DAILY. DX: E11.9, Z79.4 02/12/19   Bing NeighborsHarris, Kimberly S, FNP  albuterol (PROVENTIL HFA;VENTOLIN HFA) 108 (90 Base) MCG/ACT inhaler Inhale 2 puffs into the lungs every 6 (six) hours as needed for wheezing. 10/26/18   Glenford BayleyWalsh, Elizabeth W, NP  albuterol (PROVENTIL) (2.5 MG/3ML) 0.083% nebulizer solution Take 3 mLs (2.5 mg total) by nebulization every 6 (six) hours as needed for wheezing or shortness of breath. 10/26/18   Glenford BayleyWalsh, Elizabeth W, NP  B-D UF III MINI PEN NEEDLES 31G X 5 MM MISC Use 1 pen to inject insulin as prescribed daily.  E11.9 Type 2 diabetes 11/23/18   Bing NeighborsHarris, Kimberly S, FNP  budesonide-formoterol [facility](SYMBICORT) 160-4.5 MCG/ACT inhaler Inhale 2 puffs into the lungs 2 (two) times daily. 10/26/18   Glenford BayleyWalsh, Elizabeth W, NP  busPIRone (BUSPAR) 10 MG tablet Take 10 mg by mouth 2 (two) times daily as needed.    [provider]  cephALEXin (KEFLEX) 500 MG capsule Take 1 capsule (500 mg total) by mouth 2 (two) times daily for 5  days. 03/31/19 04/05/19  Eber HongMiller, Kirby Argueta, MD  cetirizine (ZYRTEC) 10 MG tablet Take 1 tablet (10 mg total) by mouth daily. 11/23/18   Bing NeighborsHarris, Kimberly S, FNP  cyclobenzaprine (FLEXERIL) 10 MG tablet Take 10 mg by mouth at bedtime.    [provider]  dapagliflozin propanediol (FARXIGA) 10 MG TABS tablet Take 10 mg by mouth daily. Pt takes at bedtime 11/23/18   Bing NeighborsHarris, Kimberly S, FNP  diclofenac sodium (VOLTAREN) 1 % GEL Apply 4 g topically 4 (four) times daily. 10/22/18   Bing NeighborsHarris, Kimberly S, FNP  DULoxetine (CYMBALTA) 60 MG capsule Take 1 capsule by mouth 2 (two) times daily. 09/28/18   [provider]  gabapentin (NEURONTIN) 600 MG tablet Take 600 mg by mouth at bedtime.    [provider]  guaiFENesin (MUCINEX) 600 MG 12  hr tablet Take 2 tablets (1,200 mg total) by mouth 2 (two) times daily as needed. 11/23/18   Bing NeighborsHarris, Kimberly S, FNP  insulin degludec (TRESIBA FLEXTOUCH) 100 UNIT/ML SOPN FlexTouch Pen Inject 0.2 mLs (20 Units total) into the skin daily. 11/23/18   Bing NeighborsHarris, Kimberly S, FNP  liraglutide (VICTOZA) 18 MG/3ML SOPN Inject 0.3 mLs (1.8 mg total) into the skin daily. Takes in daily morning 11/23/18   Bing NeighborsHarris, Kimberly S, FNP  lisinopril (PRINIVIL,ZESTRIL) 10 MG tablet Take by mouth. Take daily     [provider]  LORazepam (ATIVAN) 0.5 MG tablet Take 0.5 mg by mouth every 8 (eight) hours as needed for anxiety. PCP will not provide refills.    [provider]  montelukast (SINGULAIR) 10 MG tablet Take by mouth.    [provider]  omeprazole (PRILOSEC) 40 MG capsule Take by mouth.    [provider]  QUEtiapine (SEROQUEL) 300 MG tablet Take 2 tablets by mouth at bedtime. 12/28/18   [provider]  SPIRIVA HANDIHALER 18 MCG inhalation capsule Place 1 capsule (18 mcg total) into inhaler and inhale daily. 10/26/18   Glenford BayleyWalsh, Elizabeth W, NP    Family History Family History  Problem Relation Age of Onset  . Diabetes Mother   . Alcohol abuse Father   . Diabetes Sister   . Alcohol abuse Brother     Social History Social History   Tobacco Use  . Smoking status: Former Games developermoker  . Smokeless tobacco: Never Used  . Tobacco comment: quit 2016  Substance Use Topics  . Alcohol use: Never    Frequency: Never  . Drug use: Never     Allergies   Metformin and related   Review of Systems Review of Systems  Constitutional: Positive for appetite change and unexpected weight change.  Neurological: Positive for dizziness and weakness.  Psychiatric/Behavioral: Positive for hallucinations.  All other systems reviewed and are negative.    Physical Exam Updated Vital Signs BP 129/62 (BP Location: Right Arm)   Pulse (!) 113   Temp 99 F (37.2 C)   Resp 16   SpO2  94%   Physical Exam Vitals signs and nursing note reviewed.  Constitutional:      General: She is not in acute distress.    Appearance: She is well-developed.  HENT:     Head: Normocephalic and atraumatic.     Mouth/Throat:     Pharynx: No oropharyngeal exudate.  Eyes:     General: No scleral icterus.       Right eye: No discharge.        Left eye: No discharge.     Conjunctiva/sclera: Conjunctivae normal.  Pupils: Pupils are equal, round, and reactive to light.  Neck:     Musculoskeletal: Normal range of motion and neck supple.     Thyroid: No thyromegaly.     Vascular: No JVD.  Cardiovascular:     Rate and Rhythm: Regular rhythm. Tachycardia present.     Heart sounds: Normal heart sounds. No murmur. No friction rub. No gallop.   Pulmonary:     Effort: Pulmonary effort is normal. No respiratory distress.     Breath sounds: Normal breath sounds. No wheezing or rales.  Abdominal:     General: Bowel sounds are normal. There is no distension.     Palpations: Abdomen is soft. There is no mass.     Tenderness: There is no abdominal tenderness.  Musculoskeletal: Normal range of motion.        General: No tenderness.  Lymphadenopathy:     Cervical: No cervical adenopathy.  Skin:    General: Skin is warm and dry.     Findings: No erythema or rash.  Neurological:     Mental Status: She is alert.     Coordination: Coordination normal.     Comments: The patient has normal strength in all 4 extremities, follows commands without difficulty, speech is clear, face is symmetrical, cranial nerves III through XII are normal, finger-nose-finger is normal, no pronator drift, speech is clear.  Memory is completely intact.  Psychiatric:        Behavior: Behavior normal.      ED Treatments / Results  Labs (all labs ordered are listed, but only abnormal results are displayed) Labs Reviewed  CBC WITH DIFFERENTIAL/PLATELET - Abnormal; Notable for the following components:      Result  Value   WBC 11.8 (*)    Neutro Abs 9.8 (*)    All other components within normal limits  COMPREHENSIVE METABOLIC PANEL - Abnormal; Notable for the following components:   Glucose, Bld 262 (*)    Total Protein 6.2 (*)    Albumin 3.4 (*)    GFR calc non Af Amer 60 (*)    All other components within normal limits  URINALYSIS, ROUTINE W REFLEX MICROSCOPIC - Abnormal; Notable for the following components:   Color, Urine AMBER (*)    APPearance CLOUDY (*)    Ketones, ur 5 (*)    Protein, ur 30 (*)    Leukocytes,Ua MODERATE (*)    Bacteria, UA RARE (*)    All other components within normal limits  MAGNESIUM  TSH    EKG None  Radiology Dg Chest 2 View  Result Date: 03/31/2019 CLINICAL DATA:  Cough.  Recent syncopal episode EXAM: CHEST - 2 VIEW COMPARISON:  None. FINDINGS: Lungs are clear. Heart size and pulmonary vascularity are normal. No adenopathy. No bone lesions. No pneumothorax. IMPRESSION: No edema or consolidation. Electronically Signed   By: Lowella Grip III M.D.   On: 03/31/2019 12:56    Procedures Procedures (including critical care time)  Medications Ordered in ED Medications  cephALEXin (KEFLEX) capsule 500 mg (500 mg Oral Given 03/31/19 1339)     Initial Impression / Assessment and Plan / ED Course  I have reviewed the triage vital signs and the nursing notes.  Pertinent labs & imaging results that were available during my care of the patient were reviewed by me and considered in my medical decision making (see chart for details).  Clinical Course as of Mar 30 1356  Wed Mar 31, 2019  1314 I reviewed the rest  of the results including her white blood cell count which was slightly elevated at 11,800, her urinalysis which showed 11-20 white blood cells and bacteria as well as her metabolic panel which showed hyperglycemia at 260 but no signs of an anion gap or other complications.  Her magnesium level was normal, TSH is pending at this time.  She will be started  on treatment for urinary tract infection, her heart rate has come down, she states she is less anxious.  The patient does not appear to have any focal symptoms that would require advanced neuro imaging.   [BM]  1355 TSH normal   [BM]    Clinical Course User Index [BM] Eber HongMiller, Tanisia Yokley, MD       The patient is very well-appearing, she is very concerned about what could have caused her very short less than 1 hour episode of diffuse generalized weakness.  She does not have a headache or neurologic symptoms, there is no neck pain or focal neurologic abnormalities.  She does state that she took some type of medication yesterday afternoon for chest congestion and thinks this may have interacted with her Seroquel though she has been on Seroquel for many years.  She is tachycardic on exam but also states that she is very anxious about what might of caused her symptoms.  Final Clinical Impressions(s) / ED Diagnoses   Final diagnoses:  Urinary tract infection without hematuria, site unspecified  Generalized weakness    ED Discharge Orders         Ordered    cephALEXin (KEFLEX) 500 MG capsule  2 times daily     03/31/19 1356           Eber HongMiller, Gaberial Cada, MD 03/31/19 1357

## 2019-03-31 NOTE — Discharge Instructions (Signed)
Your symptoms today have improved, your testing has shown a possible urinary tract infection however at this point we can treat this with antibiotics by mouth twice a day for 5 days.  Please pick up the antibiotic from your pharmacy and take your next dose before bed.  Take these until they are completely finished  Seek medical exam for severe or worsening pain, fever, vomiting, weakness, numbness, changes in vision or speech.

## 2019-03-31 NOTE — ED Notes (Signed)
Patient transported to X-ray 

## 2019-03-31 NOTE — ED Triage Notes (Signed)
Pt states around 2100 last night she took her usual dose of Seroquel. Shortly after she began to feel strange. Her legs got weak and "buckled" under her. She also began to have visual hallucinations. Endorses this as a second occurrence.

## 2019-03-31 NOTE — ED Notes (Signed)
Pt ambulated to restroom with steady gait Tolerated well 

## 2019-04-09 ENCOUNTER — Telehealth: Payer: Self-pay

## 2019-04-09 NOTE — Telephone Encounter (Signed)
Called patient to do their pre-visit COVID screening.  Have you been tested for COVID or are you currently waiting for COVID test results? Is currently waiting on COVID test results  Have you recently traveled internationally([place], [place], [place], [place], [place]) or within the [place] to a hotspot area([place], [place], [place], [place], [place])? no  Are you currently experiencing any of the following: fever, cough, SHOB, fatigue, body aches, loss of smell, rash, diarrhea, vomiting, severe headaches, weakness, sore throat? Cough, headaches  Have you been in contact with anyone who has recently travelled? no  Have you been in contact with anyone who is experiencing any of the above symptoms or been diagnosed with [place]  or works in or has recently visited a SNF? No  Patient advised that appointment would be turned into a phone visit since she is waiting on COVID test results & is having symptoms.

## 2019-04-12 ENCOUNTER — Encounter: Payer: Self-pay | Admitting: Family Medicine

## 2019-04-12 ENCOUNTER — Ambulatory Visit (INDEPENDENT_AMBULATORY_CARE_PROVIDER_SITE_OTHER): Payer: Medicare Other | Admitting: Family Medicine

## 2019-04-12 ENCOUNTER — Other Ambulatory Visit: Payer: Self-pay

## 2019-04-12 ENCOUNTER — Inpatient Hospital Stay: Payer: Medicare Other | Admitting: Family Medicine

## 2019-04-12 ENCOUNTER — Other Ambulatory Visit: Payer: Self-pay | Admitting: Family Medicine

## 2019-04-12 DIAGNOSIS — E119 Type 2 diabetes mellitus without complications: Secondary | ICD-10-CM | POA: Diagnosis not present

## 2019-04-12 DIAGNOSIS — Z794 Long term (current) use of insulin: Secondary | ICD-10-CM

## 2019-04-12 DIAGNOSIS — J441 Chronic obstructive pulmonary disease with (acute) exacerbation: Secondary | ICD-10-CM | POA: Diagnosis not present

## 2019-04-12 DIAGNOSIS — Z09 Encounter for follow-up examination after completed treatment for conditions other than malignant neoplasm: Secondary | ICD-10-CM | POA: Diagnosis not present

## 2019-04-12 MED ORDER — PREDNISONE 20 MG PO TABS: ORAL_TABLET | ORAL | 0 refills | Status: DC

## 2019-04-12 MED ORDER — DOXYCYCLINE HYCLATE 100 MG PO TABS: 100.0000 mg | ORAL_TABLET | Freq: Two times a day (BID) | ORAL | 0 refills | Status: DC

## 2019-04-12 NOTE — Progress Notes (Signed)
Virtual Visit via Telephone Note  I connected with Alisha Roth on 04/12/19 at  3:30 PM EDT by telephone and verified that I am speaking with the correct person using two identifiers.   I discussed the limitations, risks, security and privacy concerns of performing an evaluation and management service by telephone and the availability of in person appointments. I also discussed with the patient that there may be a patient responsible charge related to this service. The patient expressed understanding and agreed to proceed.  Patient Location: Home Provider Location: [facility] Others participating in call: call initiated then transferred to me by Laurena BeringKierra Walker, CMA   History of Present Illness:      63 year old female who is status post emergency department visit on 03/31/2019 due to weakness as well as some visual hallucinations after taking Seroquel.  She also reported a few weeks of cough as well as congestion but no fever, chills or shortness of breath at the time of her ED visit.  She reports that she was diagnosed with a urinary tract infection and has been on an antibiotic which she thinks that today may be the last day of her antibiotic.  She still has some mild increased frequency of urination and mild burning with urination.  She states that she was given a medication that was 200 mg which she is finished but she states that this medicine did help decrease the pain with urination.  She denies any abdominal pain at this time.  She does feel that her cough has worsened and is now productive of white to brown sputum.  She also feels as if she is having increased wheezing.  She was also prescribed prednisone at the emergency department but she does not really feel that this is currently helping. (On review of emergency department notes, it does not appear that she was prescribed prednisone at her visit there on 03/31/2019).  She denies any current fever or chills, no nasal congestion or sore  throat.  She has had no chest pain or palpitations.  No abdominal pain, no nausea or vomiting.         Past Medical History:  Diagnosis Date  . Asthma   . COPD (chronic obstructive pulmonary disease) (HCC)   . DM (diabetes mellitus) (HCC)   . Former smoker Quit 2016  . Obesity   . Seasonal allergies     Past Surgical History:  Procedure Laterality Date  . ABDOMINAL HYSTERECTOMY    . CESAREAN SECTION    . LAPAROSCOPIC CHOLECYSTECTOMY      Family History  Problem Relation Age of Onset  . Diabetes Mother   . Alcohol abuse Father   . Diabetes Sister   . Alcohol abuse Brother     Social History   Tobacco Use  . Smoking status: Former Games developermoker  . Smokeless tobacco: Never Used  . Tobacco comment: quit 2016  Substance Use Topics  . Alcohol use: Never    Frequency: Never  . Drug use: Never     Allergies  Allergen Reactions  . Metformin And Related Diarrhea       Observations/Objective: No vital signs or physical exam conducted as visit was done via telephone; patient with a slightly hoarse speaking voice and occasional cough during today's telephone visit  Assessment and Plan: 1. COPD with exacerbation (HCC); 2.  Encounter for examination following treatment at hospital; 3.  Type 2 diabetes with long-term use of insulin Discussed with patient that her cough and wheezing might  be related to COPD.  She will be placed on doxycycline 100 mg twice daily x10 days as well as prescription for a prednisone taper as she reported that she was already taking prednisone prescribed at the ED but that she was on her last day of prednisone however on review of patient's emergency department visit note, there was no mention of patient being prescribed prednisone.  She was made aware that her blood sugars will likely be elevated due to the use of prednisone and that she needs to monitor her blood sugars carefully and call the office if her blood sugars are remaining greater than 200.  She is  encouraged to rest and remain well-hydrated.  Also suggested that she obtain over-the-counter Robitussin-DM to help of cough and congestion.  Follow-up in 1 week regarding cough/COPD and sooner if any worsening of symptoms.  If symptoms become acutely worse or she has any acute shortness of breath, chest pain or difficulty breathing, she is encouraged to go to the emergency department for further evaluation.  On review of medications, patient is on for [place] which may need to be discontinued secondary to patient's urinary tract infection.  Patient has been asked to make an in-person appointment next week in follow-up. - doxycycline (VIBRA-TABS) 100 MG tablet; Take 1 tablet (100 mg total) by mouth 2 (two) times daily.  Dispense: 20 tablet; Refill: 0 - predniSONE (DELTASONE) 20 MG tablet; Take 2 pills once daily for 2 days then 1 pill daily for 2 days then 1/2 pill daily for 4 days; take after eating breakfast  Dispense: 8 tablet; Refill: 0  Follow Up Instructions:Return in about 1 week (around 04/19/2019) for COPD/UTI- 1 week.    I discussed the assessment and treatment plan with the patient. The patient was provided an opportunity to ask questions and all were answered. The patient agreed with the plan and demonstrated an understanding of the instructions.   The patient was advised to call back or seek an in-person evaluation if the symptoms worsen or if the condition fails to improve as anticipated.  I provided 15 minutes of non-face-to-face time during this encounter.   Antony Blackbird, MD

## 2019-05-03 ENCOUNTER — Ambulatory Visit (INDEPENDENT_AMBULATORY_CARE_PROVIDER_SITE_OTHER): Payer: Medicare Other | Admitting: Family Medicine

## 2019-05-03 ENCOUNTER — Other Ambulatory Visit: Payer: Self-pay

## 2019-05-03 ENCOUNTER — Encounter: Payer: Self-pay | Admitting: Family Medicine

## 2019-05-03 DIAGNOSIS — N898 Other specified noninflammatory disorders of vagina: Secondary | ICD-10-CM | POA: Diagnosis not present

## 2019-05-03 DIAGNOSIS — J449 Chronic obstructive pulmonary disease, unspecified: Secondary | ICD-10-CM

## 2019-05-03 DIAGNOSIS — R05 Cough: Secondary | ICD-10-CM

## 2019-05-03 DIAGNOSIS — R053 Chronic cough: Secondary | ICD-10-CM

## 2019-05-03 MED ORDER — GUAIFENESIN-CODEINE 100-10 MG/5ML PO SOLN: 5.0000 mL | Freq: Four times a day (QID) | ORAL | 0 refills | Status: DC | PRN

## 2019-05-03 MED ORDER — METRONIDAZOLE 500 MG PO TABS: 500.0000 mg | ORAL_TABLET | Freq: Two times a day (BID) | ORAL | 0 refills | Status: DC

## 2019-05-03 NOTE — Progress Notes (Signed)
Virtual Visit via Telephone Note  I connected with Alisha Roth on 05/03/19 at 1:30 PM EDT by telephone and verified that I am speaking with the correct person using two identifiers.   I discussed the limitations, risks, security and privacy concerns of performing an evaluation and management service by telephone and the availability of in person appointments. I also discussed with the patient that there may be a patient responsible charge related to this service. The patient expressed understanding and agreed to proceed.  Patient Location: Home Provider Location: [facility] office Others participating in call: none   History of Present Illness:        63 year old female with COPD who complains of continued cough.  Patient states that the cough is mostly nonproductive but she occasionally gets up some clear to white phlegm.  She reports her cough did improve when she took prednisone.  Patient states that the cough is keeping her awake at night.  She reports that she does continue to use her Symbicort as well as albuterol for COPD treatment.  She denies any fever or chills.  No sore throat and no sensation of nasal congestion or postnasal drainage.  No chest pain or palpitations.          She also has complaint of a few days of a thick mostly clear vaginal discharge without odor.  Patient states that she tends to feel as if she has increased vaginal discharge after recurrent coughing.  Patient would like medication for treatment.  No itching in the vaginal area associated with the discharge.  No abdominal or pelvic pain.  No urinary frequency or dysuria.   Past Medical History:  Diagnosis Date  . Asthma   . COPD (chronic obstructive pulmonary disease) (HCC)   . DM (diabetes mellitus) (HCC)   . Former smoker Quit 2016  . Obesity   . Seasonal allergies     Past Surgical History:  Procedure Laterality Date  . ABDOMINAL HYSTERECTOMY    . CESAREAN SECTION    . LAPAROSCOPIC CHOLECYSTECTOMY      Family History  Problem Relation Age of Onset  . Diabetes Mother   . Alcohol abuse Father   . Diabetes Sister   . Alcohol abuse Brother     Social History   Tobacco Use  . Smoking status: Former Games developermoker  . Smokeless tobacco: Never Used  . Tobacco comment: quit 2016  Substance Use Topics  . Alcohol use: Never    Frequency: Never  . Drug use: Never     Allergies  Allergen Reactions  . Metformin And Related Diarrhea       Observations/Objective: No vital signs or physical exam conducted as visit was done via telephone  Assessment and Plan: 1. Stage 2 moderate COPD by GOLD classification (HCC); 2.  Chronic cough Patient is encouraged to continue use of Symbicort and albuterol for treatment of COPD.  On review of chart, patient had a normal chest x-ray in mid July.  Since patient continues to have a chronic cough, she has been asked to come into the office to have chest x-ray done this week at her convenience.  Prescription for Robitussin-DM will be sent to patient's pharmacy to see if this helps with her current cough.  If patient with continued cough, she is asked to make an actual in person office visit next week for further evaluation. - DG Chest 2 View; Future - guaiFENesin-codeine 100-10 MG/5ML syrup; Take 5 mLs by mouth every 6 (six) hours as  needed for cough.  Dispense: 120 mL; Refill: 0  3. Vaginal discharge Patient with complaint of vaginal discharge for which she request treatment.  Discussed with the patient that if the discharge was white, clumpy and associated with vaginal itching then it was more likely to be yeast related but patient reports a mostly clear, viscous discharge which more likely is associated with bacterial vaginosis though patient reports no odor to the discharge at this time.  Patient does not wish to come into the office for evaluation regarding the vaginal discharge.  Prescription sent to her pharmacy for metronidazole 500 mg twice daily x7 days and  patient was advised not to drink any alcohol while taking the metronidazole as she would most likely developed nausea and vomiting secondary to combination of alcohol and metronidazole. - metroNIDAZOLE (FLAGYL) 500 MG tablet; Take 1 tablet (500 mg total) by mouth 2 (two) times daily.  Dispense: 14 tablet; Refill: 0  Follow Up Instructions:Return in about 1 week (around 05/10/2019) for cough/COPD.    I discussed the assessment and treatment plan with the patient. The patient was provided an opportunity to ask questions and all were answered. The patient agreed with the plan and demonstrated an understanding of the instructions.   The patient was advised to call back or seek an in-person evaluation if the symptoms worsen or if the condition fails to improve as anticipated.  I provided 12 minutes of non-face-to-face time during this encounter.   Antony Blackbird, MD

## 2019-05-15 ENCOUNTER — Other Ambulatory Visit: Payer: Self-pay | Admitting: Family Medicine

## 2019-05-20 ENCOUNTER — Ambulatory Visit (HOSPITAL_COMMUNITY)
Admission: RE | Admit: 2019-05-20 | Discharge: 2019-05-20 | Disposition: A | Discharge status: Home / Self Care | Payer: Medicare Other | Source: Ambulatory Visit | Attending: Family Medicine | Admitting: Family Medicine

## 2019-05-20 ENCOUNTER — Other Ambulatory Visit: Payer: Self-pay

## 2019-05-20 DIAGNOSIS — J449 Chronic obstructive pulmonary disease, unspecified: Secondary | ICD-10-CM | POA: Diagnosis present

## 2019-05-20 DIAGNOSIS — R05 Cough: Secondary | ICD-10-CM | POA: Insufficient documentation

## 2019-05-20 DIAGNOSIS — R053 Chronic cough: Secondary | ICD-10-CM

## 2019-05-20 MED ORDER — BUDESONIDE-FORMOTEROL FUMARATE 160-4.5 MCG/ACT IN AERO: 2.0000 | INHALATION_SPRAY | Freq: Two times a day (BID) | RESPIRATORY_TRACT | 0 refills | Status: DC

## 2019-05-20 MED ORDER — ALBUTEROL SULFATE HFA 108 (90 BASE) MCG/ACT IN AERS: 2.0000 | INHALATION_SPRAY | Freq: Four times a day (QID) | RESPIRATORY_TRACT | 0 refills | Status: DC | PRN

## 2019-05-20 NOTE — Telephone Encounter (Signed)
1) Medication(s) Requested (by name):  budesonide-formoterol (SYMBICORT) 160-4.5 MCG/ACT inhaler [[id number]]   albuterol (PROVENTIL HFA;VENTOLIN HFA) 108 (90 Base) MCG/ACT inhaler    2) Pharmacy of Choice: [id number] - [place], [place] - [address]    Approved medications will be sent to pharmacy, we will reach out to you if there is an issue.  Requests made after 3pm may not be addressed until following business day!

## 2019-05-20 NOTE — Telephone Encounter (Signed)
Rxs sent

## 2019-05-25 ENCOUNTER — Telehealth: Payer: Self-pay

## 2019-05-25 ENCOUNTER — Other Ambulatory Visit: Payer: Self-pay | Admitting: Family Medicine

## 2019-05-25 DIAGNOSIS — R05 Cough: Secondary | ICD-10-CM

## 2019-05-25 DIAGNOSIS — R058 Other specified cough: Secondary | ICD-10-CM

## 2019-05-25 DIAGNOSIS — J449 Chronic obstructive pulmonary disease, unspecified: Secondary | ICD-10-CM

## 2019-05-25 NOTE — Progress Notes (Signed)
Patient notified of results & recommendations. Expressed understanding.  She wants to know what the next step is since the xray was fine. Does she need to see a Pulmonologist?

## 2019-05-25 NOTE — Progress Notes (Signed)
Patient ID: Alisha Roth, female   DOB: 01-13-1956, 63 y.o.   MRN: [id number]   63 yo female with complaint of recurrent cough and has COPD. CXR did not show a cause of patient's cough and she will be referred to pulmonology for further evaluation and treatment.

## 2019-05-25 NOTE — Telephone Encounter (Signed)
Called patient to do their pre-visit COVID screening.  Patient states that she would like to cancel appointment on 05/26/2019.

## 2019-05-26 ENCOUNTER — Ambulatory Visit: Payer: Medicare Other

## 2019-05-30 ENCOUNTER — Other Ambulatory Visit: Payer: Self-pay

## 2019-05-30 ENCOUNTER — Encounter (HOSPITAL_COMMUNITY): Payer: Self-pay

## 2019-05-30 ENCOUNTER — Ambulatory Visit (HOSPITAL_COMMUNITY)
Admission: EM | Admit: 2019-05-30 | Discharge: 2019-05-30 | Disposition: A | Discharge status: Home / Self Care | Payer: Medicare Other | Attending: Family Medicine | Admitting: Family Medicine

## 2019-05-30 DIAGNOSIS — Z794 Long term (current) use of insulin: Secondary | ICD-10-CM | POA: Insufficient documentation

## 2019-05-30 DIAGNOSIS — Z79899 Other long term (current) drug therapy: Secondary | ICD-10-CM | POA: Diagnosis not present

## 2019-05-30 DIAGNOSIS — R05 Cough: Secondary | ICD-10-CM | POA: Diagnosis present

## 2019-05-30 DIAGNOSIS — Z20828 Contact with and (suspected) exposure to other viral communicable diseases: Secondary | ICD-10-CM | POA: Diagnosis not present

## 2019-05-30 DIAGNOSIS — J449 Chronic obstructive pulmonary disease, unspecified: Secondary | ICD-10-CM | POA: Diagnosis not present

## 2019-05-30 DIAGNOSIS — N9089 Other specified noninflammatory disorders of vulva and perineum: Secondary | ICD-10-CM

## 2019-05-30 DIAGNOSIS — N898 Other specified noninflammatory disorders of vagina: Secondary | ICD-10-CM | POA: Diagnosis not present

## 2019-05-30 DIAGNOSIS — Z113 Encounter for screening for infections with a predominantly sexual mode of transmission: Secondary | ICD-10-CM

## 2019-05-30 DIAGNOSIS — Z87891 Personal history of nicotine dependence: Secondary | ICD-10-CM | POA: Insufficient documentation

## 2019-05-30 DIAGNOSIS — R030 Elevated blood-pressure reading, without diagnosis of hypertension: Secondary | ICD-10-CM | POA: Diagnosis not present

## 2019-05-30 DIAGNOSIS — F329 Major depressive disorder, single episode, unspecified: Secondary | ICD-10-CM | POA: Insufficient documentation

## 2019-05-30 DIAGNOSIS — R059 Cough, unspecified: Secondary | ICD-10-CM

## 2019-05-30 DIAGNOSIS — E119 Type 2 diabetes mellitus without complications: Secondary | ICD-10-CM | POA: Diagnosis not present

## 2019-05-30 DIAGNOSIS — Z202 Contact with and (suspected) exposure to infections with a predominantly sexual mode of transmission: Secondary | ICD-10-CM | POA: Diagnosis not present

## 2019-05-30 MED ORDER — CEFTRIAXONE SODIUM 250 MG IJ SOLR
INTRAMUSCULAR | Status: AC
  Filled 2019-05-30: qty 250

## 2019-05-30 MED ORDER — CEFTRIAXONE SODIUM 250 MG IJ SOLR
250.0000 mg | Freq: Once | INTRAMUSCULAR | Status: AC
  Administered 2019-05-30: 250 mg via INTRAMUSCULAR

## 2019-05-30 MED ORDER — METRONIDAZOLE 500 MG PO TABS: 500.0000 mg | ORAL_TABLET | Freq: Two times a day (BID) | ORAL | 0 refills | Status: DC

## 2019-05-30 MED ORDER — AZITHROMYCIN 250 MG PO TABS
1000.0000 mg | ORAL_TABLET | Freq: Once | ORAL | Status: AC
  Administered 2019-05-30: 1000 mg via ORAL

## 2019-05-30 MED ORDER — FLUCONAZOLE 150 MG PO TABS: 150.0000 mg | ORAL_TABLET | Freq: Every day | ORAL | 0 refills | Status: DC

## 2019-05-30 MED ORDER — AZITHROMYCIN 250 MG PO TABS
ORAL_TABLET | ORAL | Status: AC
  Filled 2019-05-30: qty 4

## 2019-05-30 NOTE — ED Triage Notes (Addendum)
Pt cc she has vaginal sores x 1 week. Pt states she has the sores on the inside and out of her vagina. Pt states she has some rectal pain as well. Pt says she has a deep cough as well.

## 2019-05-30 NOTE — ED Provider Notes (Signed)
[facility]    CSN: [id number] Arrival date & time: 05/30/19  1500      History   Chief Complaint Chief Complaint  Patient presents with   vaginal sores   Cough    HPI Alisha Roth is a 63 y.o. female.   Patient presents with 1 week history of vaginal sores, rash, and vaginal discharge.  She is sexually active without condom use.  She denies history of STDs.  She denies fever, chills, dysuria, abdominal pain, pelvic pain, back pain, or other symptoms.  Patient also reports a chronic cough and request a COVID test.  Medical history is significant for COPD, diabetes, seasonal allergies.  The history is provided by the patient.    Past Medical History:  Diagnosis Date   Asthma    COPD (chronic obstructive pulmonary disease) (HCC)    DM (diabetes mellitus) (HCC)    Former smoker Quit 2016   Obesity    Seasonal allergies     Patient Active Problem List   Diagnosis Date Noted   Stage 2 moderate COPD by GOLD classification (HCC) 10/26/2018   SHOULDER PAIN, LEFT 07/13/2009   TOBACCO ABUSE 12/26/2008   SCABIES 09/01/2008   CONDYLOMA ACUMINATUM 10/20/2007   HEADACHE 10/20/2007   DEPRESSION 05/25/2007   ASTHMA 05/25/2007   MENOPAUSAL SYNDROME 05/25/2007   PLANTAR FASCITIS 12/04/2005   ALLERGIC RHINITIS 11/20/2005    Past Surgical History:  Procedure Laterality Date   ABDOMINAL HYSTERECTOMY     CESAREAN SECTION     LAPAROSCOPIC CHOLECYSTECTOMY      OB History   No obstetric history on file.      Home Medications    Prior to Admission medications   Medication Sig Start Date End Date Taking? Authorizing Provider  Accu-Chek FastClix Lancets MISC TEST UPTO 4 TIMES A DAY 11/23/18   [provider]  ACCU-CHEK GUIDE test strip USE AS DIRECTED UP TO 4 TIMES DAILY. DX: E11.9, Z79.4 02/12/19   Bing Neighbors, FNP  albuterol (PROVENTIL) (2.5 MG/3ML) 0.083% nebulizer solution Take 3 mLs (2.5 mg total) by nebulization every 6  (six) hours as needed for wheezing or shortness of breath. 10/26/18   Glenford Bayley, NP  albuterol (VENTOLIN HFA) 108 (90 Base) MCG/ACT inhaler Inhale 2 puffs into the lungs every 6 (six) hours as needed for wheezing. 05/20/19   Fulp, Cammie, MD  B-D UF III MINI PEN NEEDLES 31G X 5 MM MISC Use 1 pen to inject insulin as prescribed daily.  E11.9 Type 2 diabetes 11/23/18   Bing Neighbors, FNP  budesonide-formoterol [facility]) 160-4.5 MCG/ACT inhaler Inhale 2 puffs into the lungs 2 (two) times daily. 05/20/19   Fulp, Cammie, MD  busPIRone (BUSPAR) 10 MG tablet Take 10 mg by mouth 2 (two) times daily as needed.    [provider]  cetirizine (ZYRTEC) 10 MG tablet Take 1 tablet (10 mg total) by mouth daily. 11/23/18   Bing Neighbors, FNP  cyclobenzaprine (FLEXERIL) 10 MG tablet Take 10 mg by mouth at bedtime.    [provider]  diclofenac sodium (VOLTAREN) 1 % GEL Apply 4 g topically 4 (four) times daily. 10/22/18   Bing Neighbors, FNP  DULoxetine (CYMBALTA) 60 MG capsule Take 1 capsule by mouth 2 (two) times daily. 09/28/18   [provider]  FARXIGA 10 MG TABS tablet TAKE 1 TABLET BY MOUTH EVERY DAY AT BEDTIME 05/20/19   Fulp, Cammie, MD  fluconazole (DIFLUCAN) 150 MG tablet Take 1 tablet (150  mg total) by mouth daily. Take one tablet today.  May repeat once in 3 days. 05/30/19   Mickie Bailate, Koriana Stepien H, NP  gabapentin (NEURONTIN) 600 MG tablet Take 600 mg by mouth at bedtime.    [provider]  guaiFENesin-codeine 100-10 MG/5ML syrup Take 5 mLs by mouth every 6 (six) hours as needed for cough. 05/03/19   Fulp, Cammie, MD  liraglutide (VICTOZA) 18 MG/3ML SOPN Inject 0.3 mLs (1.8 mg total) into the skin daily. Takes in daily morning 11/23/18   Bing NeighborsHarris, Kimberly S, FNP  lisinopril (PRINIVIL,ZESTRIL) 10 MG tablet Take by mouth. Take daily     [provider]  LORazepam (ATIVAN) 0.5 MG tablet Take 0.5 mg by mouth every 8 (eight) hours as needed for anxiety. PCP will not  provide refills.    [provider]  metroNIDAZOLE (FLAGYL) 500 MG tablet Take 1 tablet (500 mg total) by mouth 2 (two) times daily. 05/30/19   Mickie Bailate, Zaelyn Barbary H, NP  montelukast (SINGULAIR) 10 MG tablet Take by mouth.    [provider]  omeprazole (PRILOSEC) 40 MG capsule Take by mouth.    [provider]  predniSONE (DELTASONE) 20 MG tablet Take 20 mg by mouth daily with breakfast.    [provider]  QUEtiapine (SEROQUEL) 300 MG tablet Take 2 tablets by mouth at bedtime. 12/28/18   [provider]  SPIRIVA HANDIHALER 18 MCG inhalation capsule Place 1 capsule (18 mcg total) into inhaler and inhale daily. 10/26/18   Glenford BayleyWalsh, Elizabeth W, NP  TRESIBA FLEXTOUCH 100 UNIT/ML SOPN FlexTouch Pen INJECT 20 UNITS INTO THE SKIN DAILY. 04/12/19   Grayce SessionsEdwards, Michelle P, NP    Family History Family History  Problem Relation Age of Onset   Diabetes Mother    Alcohol abuse Father    Diabetes Sister    Alcohol abuse Brother     Social History Social History   Tobacco Use   Smoking status: Current Every Day Smoker    Types: Cigarettes   Smokeless tobacco: Never Used   Tobacco comment: quit 2016  Substance Use Topics   Alcohol use: Never    Frequency: Never   Drug use: Never     Allergies   Metformin and related   Review of Systems Review of Systems  Constitutional: Negative for chills and fever.  HENT: Negative for ear pain and sore throat.   Eyes: Negative for pain and visual disturbance.  Respiratory: Positive for cough. Negative for shortness of breath.   Cardiovascular: Negative for chest pain and palpitations.  Gastrointestinal: Negative for abdominal pain, diarrhea and vomiting.  Genitourinary: Positive for vaginal discharge. Negative for dysuria, flank pain, hematuria and pelvic pain.  Musculoskeletal: Negative for arthralgias and back pain.  Skin: Positive for rash and wound. Negative for color change.  Neurological: Negative for  dizziness, tremors, seizures, syncope, facial asymmetry, speech difficulty, weakness, light-headedness, numbness and headaches.  All other systems reviewed and are negative.    Physical Exam Triage Vital Signs ED Triage Vitals [05/30/19 1512]  Enc Vitals Group     BP      Pulse      Resp      Temp      Temp src      SpO2      Weight 152 lb (68.9 kg)     Height      Head Circumference      Peak Flow      Pain Score 10     Pain Loc  Pain Edu?      Excl. in [place]?    No data found.  Updated Vital Signs BP (!) 161/93 (BP Location: Right Arm)    Pulse 68    Temp 98.1 F (36.7 C) (Oral)    Resp 18    Wt 152 lb (68.9 kg)    SpO2 98%    BMI 28.72 kg/m   Visual Acuity Right Eye Distance:   Left Eye Distance:   Bilateral Distance:    Right Eye Near:   Left Eye Near:    Bilateral Near:     Physical Exam Vitals signs and nursing note reviewed.  Constitutional:      General: She is not in acute distress.    Appearance: She is well-developed.  HENT:     Head: Normocephalic and atraumatic.     Mouth/Throat:     Mouth: Mucous membranes are moist.     Pharynx: Oropharynx is clear.  Eyes:     Conjunctiva/sclera: Conjunctivae normal.  Neck:     Musculoskeletal: Neck supple.  Cardiovascular:     Rate and Rhythm: Normal rate and regular rhythm.     Heart sounds: No murmur.  Pulmonary:     Effort: Pulmonary effort is normal. No respiratory distress.     Breath sounds: Normal breath sounds.  Abdominal:     General: Bowel sounds are normal.     Palpations: Abdomen is soft.     Tenderness: There is no abdominal tenderness. There is no right CVA tenderness, left CVA tenderness, guarding or rebound.  Genitourinary:    Labia:        Right: Rash and tenderness present.        Left: Rash and tenderness present.      Vagina: Vaginal discharge present.       Comments: External erythema; skin raw and tender.  Moderate amount of cream-white vaginal discharge. Skin:    General:  Skin is warm and dry.  Neurological:     General: No focal deficit present.     Mental Status: She is alert and oriented to person, place, and time.      UC Treatments / Results  Labs (all labs ordered are listed, but only abnormal results are displayed) Labs Reviewed  HSV CULTURE AND TYPING  NOVEL CORONAVIRUS, NAA (HOSP ORDER, SEND-OUT TO REF LAB; TAT 18-24 HRS)  RPR  HIV ANTIBODY (ROUTINE TESTING W REFLEX)  CERVICOVAGINAL ANCILLARY ONLY    EKG   Radiology No results found.  Procedures Procedures (including critical care time)  Medications Ordered in UC Medications  azithromycin (ZITHROMAX) tablet 1,000 mg (has no administration in time range)  cefTRIAXone (ROCEPHIN) injection 250 mg (has no administration in time range)    Initial Impression / Assessment and Plan / UC Course  I have reviewed the triage vital signs and the nursing notes.  Pertinent labs & imaging results that were available during my care of the patient were reviewed by me and considered in my medical decision making (see chart for details).    Vaginal discharge, potential exposure to STD.  Cough.  Elevated blood pressure without diagnosis of hypertension.  Treated today with Rocephin, Zithromax, metronidazole.  Vaginal swab for STDs and HSV.  Also testing for HIV and RPR.  Instructed patient not to have sex for 7 days; patient states that she doubts she will be able to do this.  COVID test performed here.  Instructed patient to self quarantine until her test results are back.  Instructed  patient to go to the emergency department if she develops high fever, shortness of breath, severe diarrhea, or other concerning symptoms.  Discussed with patient that her blood pressure is elevated today needs to be rechecked by her PCP in 1 to 2 weeks.  Patient agrees with plan of care.   Final Clinical Impressions(s) / UC Diagnoses   Final diagnoses:  Vaginal discharge  Potential exposure to STD  Cough  Elevated  blood-pressure reading without diagnosis of hypertension     Discharge Instructions     You were treated with two antibiotics today, Rocephin and Zithromax.  Additionally, you are prescribed metronidazole; take this twice a day for 7 days.    Do not have sex for 7 days.  Your STD tests are pending.  If your test results are positive, we will call you.  You may need additional treatment and your partner may also need treatment.      Your COVID test is pending.  You should self quarantine until your test result is back and is negative.    Go to the emergency department if you develop shortness of breath, high fever, severe diarrhea, or other concerning symptoms.    Your blood pressure is elevated today at 161/93.  Please have this rechecked by your primary care provider in 2 weeks.  If you do not have a primary care provider, one is suggested below.       ED Prescriptions    Medication Sig Dispense Auth. Provider   metroNIDAZOLE (FLAGYL) 500 MG tablet Take 1 tablet (500 mg total) by mouth 2 (two) times daily. 14 tablet Mickie Bailate, Daphne Karrer H, NP   fluconazole (DIFLUCAN) 150 MG tablet Take 1 tablet (150 mg total) by mouth daily. Take one tablet today.  May repeat once in 3 days. 2 tablet Mickie Bailate, Francisco Ostrovsky H, NP     Controlled Substance Prescriptions [place] Controlled Substance Registry consulted? Not Applicable   Mickie Bailate, Lindsay Straka H, NP 05/30/19 1626

## 2019-05-30 NOTE — Discharge Instructions (Signed)
You were treated with two antibiotics today, Rocephin and Zithromax.  Additionally, you are prescribed metronidazole; take this twice a day for 7 days.    Do not have sex for 7 days.  Your STD tests are pending.  If your test results are positive, we will call you.  You may need additional treatment and your partner may also need treatment.      Your COVID test is pending.  You should self quarantine until your test result is back and is negative.    Go to the emergency department if you develop shortness of breath, high fever, severe diarrhea, or other concerning symptoms.    Your blood pressure is elevated today at 161/93.  Please have this rechecked by your primary care provider in 2 weeks.  If you do not have a primary care provider, one is suggested below.

## 2019-05-31 LAB — NOVEL CORONAVIRUS, NAA (HOSP ORDER, SEND-OUT TO REF LAB; TAT 18-24 HRS): SARS-CoV-2, NAA: NOT DETECTED

## 2019-05-31 LAB — RPR: RPR Ser Ql: NONREACTIVE

## 2019-06-01 LAB — HIV ANTIBODY (ROUTINE TESTING W REFLEX): HIV Screen 4th Generation wRfx: NONREACTIVE

## 2019-06-01 LAB — CERVICOVAGINAL ANCILLARY ONLY
Bacterial vaginitis: POSITIVE — AB
Candida vaginitis: POSITIVE — AB
Chlamydia: NEGATIVE
Neisseria Gonorrhea: NEGATIVE
Trichomonas: NEGATIVE

## 2019-06-01 LAB — HSV CULTURE AND TYPING

## 2019-06-02 ENCOUNTER — Telehealth (HOSPITAL_COMMUNITY): Payer: Self-pay | Admitting: Emergency Medicine

## 2019-06-02 MED ORDER — VALACYCLOVIR HCL 1 G PO TABS: 1000.0000 mg | ORAL_TABLET | Freq: Three times a day (TID) | ORAL | 0 refills | Status: DC

## 2019-06-02 NOTE — Telephone Encounter (Signed)
Herpes positive type 2, Patient contacted and made aware of    results, all questions answered Bacterial Vaginosis test is positive.  Prescription for metronidazole was given at the urgent care visit. Candida (yeast) is positive.  Prescription for fluconazole was given at the urgent care visit.    Patient contacted and made aware of    results, all questions answered  Per kelly, to send valtrex.

## 2019-07-05 ENCOUNTER — Inpatient Hospital Stay: Payer: Medicare Other

## 2019-07-06 ENCOUNTER — Inpatient Hospital Stay: Payer: Medicare Other

## 2019-07-06 NOTE — Progress Notes (Deleted)
Patient following up on recent urgent care visit. Was seen for cough & vaginal sores. Labs done came back positive for HSV-2, BV & yeast.

## 2019-07-13 ENCOUNTER — Inpatient Hospital Stay: Payer: Medicare Other

## 2019-07-13 ENCOUNTER — Institutional Professional Consult (permissible substitution): Payer: Medicare Other | Admitting: Pulmonary Disease

## 2019-07-16 NOTE — Progress Notes (Signed)
Following up on urgent care visit. Was seen for cough & vaginal discharge. Has in person appointment with pulmonology on 07/20/2019. Labs done at [facility] revealed HSV2 diagnosis.

## 2019-07-19 ENCOUNTER — Ambulatory Visit (INDEPENDENT_AMBULATORY_CARE_PROVIDER_SITE_OTHER): Payer: Medicare Other | Admitting: Family Medicine

## 2019-07-19 DIAGNOSIS — Z794 Long term (current) use of insulin: Secondary | ICD-10-CM

## 2019-07-19 DIAGNOSIS — B009 Herpesviral infection, unspecified: Secondary | ICD-10-CM | POA: Diagnosis not present

## 2019-07-19 DIAGNOSIS — J441 Chronic obstructive pulmonary disease with (acute) exacerbation: Secondary | ICD-10-CM

## 2019-07-19 DIAGNOSIS — E119 Type 2 diabetes mellitus without complications: Secondary | ICD-10-CM

## 2019-07-19 DIAGNOSIS — J4521 Mild intermittent asthma with (acute) exacerbation: Secondary | ICD-10-CM | POA: Diagnosis not present

## 2019-07-19 DIAGNOSIS — J449 Chronic obstructive pulmonary disease, unspecified: Secondary | ICD-10-CM

## 2019-07-19 DIAGNOSIS — Z79899 Other long term (current) drug therapy: Secondary | ICD-10-CM

## 2019-07-19 DIAGNOSIS — Z09 Encounter for follow-up examination after completed treatment for conditions other than malignant neoplasm: Secondary | ICD-10-CM

## 2019-07-19 MED ORDER — VALACYCLOVIR HCL 500 MG PO TABS: 500.0000 mg | ORAL_TABLET | Freq: Every day | ORAL | 3 refills | Status: DC

## 2019-07-19 MED ORDER — ACCU-CHEK FASTCLIX LANCETS MISC: 99 refills | Status: AC

## 2019-07-19 MED ORDER — PREDNISONE 20 MG PO TABS: ORAL_TABLET | ORAL | 0 refills | Status: DC

## 2019-07-19 MED ORDER — ALBUTEROL SULFATE HFA 108 (90 BASE) MCG/ACT IN AERS: 2.0000 | INHALATION_SPRAY | Freq: Four times a day (QID) | RESPIRATORY_TRACT | 0 refills | Status: DC | PRN

## 2019-07-19 MED ORDER — BUDESONIDE-FORMOTEROL FUMARATE 160-4.5 MCG/ACT IN AERO: 2.0000 | INHALATION_SPRAY | Freq: Two times a day (BID) | RESPIRATORY_TRACT | 0 refills | Status: DC

## 2019-07-19 MED ORDER — ACCU-CHEK GUIDE W/DEVICE KIT: 1.0000 | PACK | Freq: Four times a day (QID) | 0 refills | Status: AC | PRN

## 2019-07-19 MED ORDER — ACCU-CHEK GUIDE VI STRP: ORAL_STRIP | 1 refills | Status: AC

## 2019-07-19 MED ORDER — AZITHROMYCIN 250 MG PO TABS: ORAL_TABLET | ORAL | 0 refills | Status: DC

## 2019-07-19 NOTE — Progress Notes (Signed)
Virtual Visit via Telephone Note  I connected with Alisha Roth on 07/19/19 at 10:10 AM EST by telephone and verified that I am speaking with the correct person using two identifiers.   I discussed the limitations, risks, security and privacy concerns of performing an evaluation and management service by telephone and the availability of in person appointments. I also discussed with the patient that there may be a patient responsible charge related to this service. The patient expressed understanding and agreed to proceed.  Patient Location: Home Provider Location: [facility] Others participating in call: none   History of Present Illness:      63 year old female who is status post recent emergency department visit on 05/30/2019 due to complaint of chronic cough for which she requested Covid testing which was negative.  Patient does have history of COPD/asthma.  Patient was also seen due to complaint of vaginal sores, rash and vaginal discharge per emergency department notes.  Patient had testing of vaginal discharge and was presumptively treated with azithromycin and Rocephin in the ED and prescription was provided for metronidazole and Diflucan.  Patient reports that she was notified of a positive test for the herpes virus and prescription was sent in for medication for treatment.  Patient would like to be on medication long-term to help reduce the risk of her transmitting herpes virus to a future partner.        Patient reports that she continues to have cough which is often productive of yellowish sputum as well as continued sensation of shortness of breath and chest tightness/wheeze.  She denies any fever or chills, no headache or dizziness.  No sore throat, no loss of sensation of taste or smell.  She does need refill of medications for treatment of COPD.  She also reports that she has appointment tomorrow with pulmonology.  She reports that she also continues to smoke.  She is not sure she  can stop smoking as this also helps with anxiety.         Patient on review chart also has had history of diabetes.  She reports that she has not been monitoring home blood sugars.  She reports that her glucometer has not been working and she also needs additional diabetic testing supplies.  She reports that she is taking medications currently for her blood sugars.  She does have some issues with urinary frequency and increased thirst.  She denies any issues with chest pain or palpitations, no abdominal pain-no nausea/vomiting/diarrhea or constipation.  No peripheral edema.  She does have occasional numbness in her feet.   Past Medical History:  Diagnosis Date  . Asthma   . COPD (chronic obstructive pulmonary disease) ([place])   . DM (diabetes mellitus) ([place])   . Former smoker Quit 2016  . Obesity   . Seasonal allergies     Past Surgical History:  Procedure Laterality Date  . ABDOMINAL HYSTERECTOMY    . CESAREAN SECTION    . LAPAROSCOPIC CHOLECYSTECTOMY      Family History  Problem Relation Age of Onset  . Diabetes Mother   . Alcohol abuse Father   . Diabetes Sister   . Alcohol abuse Brother     Social History   Tobacco Use  . Smoking status: Current Every Day Smoker    Types: Cigarettes  . Smokeless tobacco: Never Used  . Tobacco comment: quit 2016  Substance Use Topics  . Alcohol use: Never    Frequency: Never  . Drug use: Never  Allergies  Allergen Reactions  . Metformin And Related Diarrhea       Observations/Objective: No vital signs or physical exam conducted as visit was done via telephone  Assessment and Plan: 1. Type 2 diabetes mellitus without complication, with long-term current use of insulin [facility]) Prescription sent to patient's pharmacy for new glucometer monitoring supplies.  Patient has been asked to come into the office to have hemoglobin A1c and comprehensive metabolic panel and to schedule appointment and follow-up of her diabetes.  She is  encouraged to keep a blood sugar diary as well as follow a diabetic diet and be compliant with medications. - Accu-Chek FastClix Lancets MISC; Use to check blood sugars up to 4 times per day  Dispense: 120 each; Refill: prn - ACCU-CHEK GUIDE test strip; USE AS DIRECTED UP TO 4 TIMES DAILY. DX: E11.9, Z79.4  Dispense: 200 each; Refill: 1 - Blood Glucose Monitoring Suppl (ACCU-CHEK GUIDE) w/Device KIT; 1 kit by Does not apply route 4 (four) times daily as needed. To monitor blood sugars  Dispense: 1 kit; Refill: 0 - Comprehensive metabolic panel; Future - Hemoglobin A1C; Future - Lipid Panel; Future  2. Stage 2 moderate COPD by GOLD classification ([place]) 3. COPD with exacerbation [facility]) Patient reports that she has appointment tomorrow with pulmonology.  Complete smoking cessation is advised and encouraged.  Patient provided with refill of Symbicort but states that she still has albuterol.  Patient is being placed on a short 5-day course of prednisone as well as azithromycin as she reports continued productive cough, chest congestion and wheeze - budesonide-formoterol (SYMBICORT) 160-4.5 MCG/ACT inhaler; Inhale 2 puffs into the lungs 2 (two) times daily.  Dispense: 10.2 g; Refill: 0 - predniSONE (DELTASONE) 20 MG tablet; Take two pills once per day for 5 days; take after eating  Dispense: 10 tablet; Refill: 0 - azithromycin (ZITHROMAX) 250 MG tablet; Take 2 pills the first day then once daily for 4 days  Dispense: 6 tablet; Refill: 0  4. Mild intermittent asthma with exacerbation Patient encouraged to keep her appointment tomorrow with pulmonology.  Smoking cessation encouraged.  5-day prescription for prednisone and azithromycin Z-Pak and continue use of albuterol inhaler - budesonide-formoterol (SYMBICORT) 160-4.5 MCG/ACT inhaler; Inhale 2 puffs into the lungs 2 (two) times daily.  Dispense: 10.2 g; Refill: 0 - predniSONE (DELTASONE) 20 MG tablet; Take two pills once per day for 5 days; take after  eating  Dispense: 10 tablet; Refill: 0 - azithromycin (ZITHROMAX) 250 MG tablet; Take 2 pills the first day then once daily for 4 days  Dispense: 6 tablet; Refill: 0  5. HSV-2 infection; 6.  Encounter for examination following treatment at hospital Patient's recent emergency department notes reviewed and patient with diagnosis of HSV.  Discussed suppressive therapy options and patient would like to try Valtrex 500 mg daily.  She has asked to start medication after completion of azithromycin and prednisone so that if she has any side effects from the medication she will be better able to tell that side effects caused by the valacyclovir and not from prednisone/azithromycin.  She is also advised to use backup form of barrier contraception to help reduce risk of transmission until she has been on valacyclovir for at least 4 weeks. - valACYclovir (VALTREX) 500 MG tablet; Take 1 tablet (500 mg total) by mouth daily.  Dispense: 90 tablet; Refill: 3 -Comprehensive metabolic panel  7. Encounter for long-term (current) use of high-risk medication Comprehensive metabolic panel in follow-up of use of medications both long-term  and due to recent treatment with valacyclovir and plan for ongoing therapy - Comprehensive metabolic panel; Future  Follow Up Instructions:Return for DM-lab visit this week; 2 to 3-week follow-up diabetes.    I discussed the assessment and treatment plan with the patient. The patient was provided an opportunity to ask questions and all were answered. The patient agreed with the plan and demonstrated an understanding of the instructions.   The patient was advised to call back or seek an in-person evaluation if the symptoms worsen or if the condition fails to improve as anticipated.  I provided 17 minutes of non-face-to-face time during this encounter.   Antony Blackbird, MD

## 2019-07-20 ENCOUNTER — Encounter: Payer: Self-pay | Admitting: Family Medicine

## 2019-07-20 ENCOUNTER — Encounter: Payer: Self-pay | Admitting: Pulmonary Disease

## 2019-07-20 ENCOUNTER — Other Ambulatory Visit: Payer: Self-pay

## 2019-07-20 ENCOUNTER — Ambulatory Visit (INDEPENDENT_AMBULATORY_CARE_PROVIDER_SITE_OTHER): Payer: Medicare Other | Admitting: Pulmonary Disease

## 2019-07-20 VITALS — BP 150/98 | HR 96 | Temp 97.8°F | Ht 61.0 in | Wt 152.4 lb

## 2019-07-20 DIAGNOSIS — J441 Chronic obstructive pulmonary disease with (acute) exacerbation: Secondary | ICD-10-CM | POA: Diagnosis not present

## 2019-07-20 DIAGNOSIS — Z716 Tobacco abuse counseling: Secondary | ICD-10-CM | POA: Diagnosis not present

## 2019-07-20 MED ORDER — BREZTRI AEROSPHERE 160-9-4.8 MCG/ACT IN AERO: 2.0000 | INHALATION_SPRAY | Freq: Two times a day (BID) | RESPIRATORY_TRACT | 0 refills | Status: DC

## 2019-07-20 MED ORDER — ALBUTEROL SULFATE HFA 108 (90 BASE) MCG/ACT IN AERS: 2.0000 | INHALATION_SPRAY | Freq: Four times a day (QID) | RESPIRATORY_TRACT | 5 refills | Status: DC | PRN

## 2019-07-20 NOTE — Patient Instructions (Signed)
Finish course of prednisone and zithromax from your primary care doctor.  Will send new script to take place of proair.  Try using breztri two puffs in the morning and two puffs in the evening, and rinse your mouth after each use.  This will take the place of spiriva and symbicort.  Follow up in 2 weeks with Dr. Valeta Harms.

## 2019-07-20 NOTE — Progress Notes (Signed)
[facility]  Chief Complaint  Patient presents with  . Consult    Chronic obstructive pulmonary disease - Stage 2 gold classification.    Constitutional:  BP (!) 150/98 (BP Location: Right Arm, Patient Position: Sitting, Cuff Size: Normal)   Pulse 96   Temp 97.8 F (36.6 C)   Ht '5\' 1"'  (1.549 m)   Wt 152 lb 6.4 oz (69.1 kg)   SpO2 98% Comment: on room air  BMI 28.80 kg/m   Past Medical History:  Allergies, DM  Brief Summary:  Alisha Roth is a 63 y.o. female smoker with COPD.  Followed by Dr. Valeta Harms.  Not seen in pulmonary office since February 2020.    Continues to smoke cigarettes.  Had previously quit when she was in hospital.  Has been using symbicort and spiriva.  Has more cough, wheeze, chest congestion.  Getting short of breath more easily.  Saw PCP recently and started on zithromax and prednisone.  Took first dose of zithromax yesterday and prednisone today.  She has proair, but doesn't feel this works well and wants to try different inhaler.  She also would like to try something besides symbicort and spiriva.   Physical Exam:   Appearance - well kempt   ENMT - clear nasal mucosa, midline nasal  septum, no oral exudates, no LAN, trachea midline  Respiratory - normal chest wall, normal respiratory effort, no accessory muscle use, b/l expiratory wheezing  CV - s1s2 regular rate and rhythm, no murmurs, no peripheral edema, radial pulses symmetric  GI - soft, non tender, no masses  Lymph - no adenopathy noted in neck and axillary areas  MSK - normal gait  Ext - no cyanosis, clubbing, or joint inflammation noted  Skin - no rashes, lesions, or ulcers  Neuro - normal strength, oriented x 3  Psych - normal mood and affect   Assessment/Plan:   COPD exacerbation. - complete course of zithromax and prednisone - will change from symbicort and spiriva to breztri two puffs bid (samples given) - will change from proair to  proventil two puffs q6h prn - continue singulair for now - f/u in 2 weeks with Dr. Valeta Harms to reassess status of COPD therapy - will need to make sure she is up to date with pneumonia vaccination at next visit  Tobacco abuse. - reviewed options to assist with smoking cessation - she will try quitting on her own for now - would also need to discuss if she is candidate for lung cancer screening at next visit  History of obstructive sleep apnea. - mild based on sleep study from 2019 - assess further after she has recovered from COPD exacerbation   Patient Instructions  Finish course of prednisone and zithromax from your primary care doctor.  Will send new script to take place of proair.  Try using breztri two puffs in the morning and two puffs in the evening, and rinse your mouth after each use.  This will take the place of spiriva and symbicort.  Follow up in 2 weeks with Dr. Valeta Harms.  A total of  29 minutes were spent face to face with the patient and more than half of that time involved counseling or coordination of care.   Chesley Mires, MD  Pulmonary/Critical Care Pager: [phone] 07/20/2019, 11:36 AM  Flow Sheet     Pulmonary tests:  PFT 10/26/18 >> FEV1 1.34 (60%), FEV1% 68, TLC 4.17 (90%), DLCO 76%, + BD  Sleep tests:  PSG  06/11/18 >> AHI 9.8, SpO2 low 77%  Medications:   Allergies as of 07/20/2019      Reactions   Metformin And Related Diarrhea      Medication List       Accurate as of July 20, 2019 11:36 AM. If you have any questions, ask your nurse or doctor.        Accu-Chek FastClix Lancets Misc Use to check blood sugars up to 4 times per day   Accu-Chek Guide test strip Generic drug: glucose blood USE AS DIRECTED UP TO 4 TIMES DAILY. DX: E11.9, Z79.4   Accu-Chek Guide w/Device Kit 1 kit by Does not apply route 4 (four) times daily as needed. To monitor blood sugars   albuterol (2.5 MG/3ML) 0.083% nebulizer solution Commonly known as:  PROVENTIL Take 3 mLs (2.5 mg total) by nebulization every 6 (six) hours as needed for wheezing or shortness of breath. What changed: Another medication with the same name was changed. Make sure you understand how and when to take each. Changed by: Chesley Mires, MD   albuterol 108 (90 Base) MCG/ACT inhaler Commonly known as: Proventil HFA Inhale 2 puffs into the lungs every 6 (six) hours as needed for wheezing or shortness of breath. What changed: reasons to take this Changed by: Chesley Mires, MD   azithromycin 250 MG tablet Commonly known as: ZITHROMAX Take 2 pills the first day then once daily for 4 days   B-D UF III MINI PEN NEEDLES 31G X 5 MM Misc Generic drug: Insulin Pen Needle Use 1 pen to inject insulin as prescribed daily.  E11.9 Type 2 diabetes   Breztri Aerosphere 160-9-4.8 MCG/ACT Aero Generic drug: Budeson-Glycopyrrol-Formoterol Inhale 2 puffs into the lungs 2 (two) times daily. Started by: Chesley Mires, MD   budesonide-formoterol 160-4.5 MCG/ACT inhaler Commonly known as: Symbicort Inhale 2 puffs into the lungs 2 (two) times daily.   busPIRone 10 MG tablet Commonly known as: BUSPAR Take 10 mg by mouth 2 (two) times daily as needed.   cetirizine 10 MG tablet Commonly known as: ZYRTEC Take 1 tablet (10 mg total) by mouth daily.   diclofenac sodium 1 % Gel Commonly known as: VOLTAREN Apply 4 g topically 4 (four) times daily.   DULoxetine 60 MG capsule Commonly known as: CYMBALTA Take 1 capsule by mouth 2 (two) times daily.   Farxiga 10 MG Tabs tablet Generic drug: dapagliflozin propanediol TAKE 1 TABLET BY MOUTH EVERY DAY AT BEDTIME   gabapentin 600 MG tablet Commonly known as: NEURONTIN Take 600 mg by mouth at bedtime.   guaiFENesin-codeine 100-10 MG/5ML syrup Take 5 mLs by mouth every 6 (six) hours as needed for cough.   liraglutide 18 MG/3ML Sopn Commonly known as: Victoza Inject 0.3 mLs (1.8 mg total) into the skin daily. Takes in daily morning    lisinopril 10 MG tablet Commonly known as: ZESTRIL Take by mouth. Take daily   LORazepam 0.5 MG tablet Commonly known as: ATIVAN Take 0.5 mg by mouth every 8 (eight) hours as needed for anxiety. PCP will not provide refills.   montelukast 10 MG tablet Commonly known as: SINGULAIR Take by mouth.   omeprazole 40 MG capsule Commonly known as: PRILOSEC Take by mouth.   predniSONE 20 MG tablet Commonly known as: DELTASONE Take two pills once per day for 5 days; take after eating   QUEtiapine 300 MG tablet Commonly known as: SEROQUEL Take 2 tablets by mouth at bedtime.   Spiriva HandiHaler 18 MCG inhalation capsule Generic drug:  tiotropium Place 1 capsule (18 mcg total) into inhaler and inhale daily.   Tyler Aas FlexTouch 100 UNIT/ML Sopn FlexTouch Pen Generic drug: insulin degludec INJECT 20 UNITS INTO THE SKIN DAILY.   valACYclovir 500 MG tablet Commonly known as: VALTREX Take 1 tablet (500 mg total) by mouth daily.       Past Surgical History:  She  has a past surgical history that includes Laparoscopic cholecystectomy; Cesarean section; and Abdominal hysterectomy.  Family History:  Her family history includes Alcohol abuse in her brother and father; Diabetes in her mother and sister.  Social History:  She  reports that she has been smoking cigarettes. She has never used smokeless tobacco. She reports that she does not drink alcohol or use drugs.

## 2019-07-22 ENCOUNTER — Other Ambulatory Visit: Payer: Medicare Other

## 2019-08-04 ENCOUNTER — Ambulatory Visit: Payer: Medicare Other | Admitting: Pulmonary Disease

## 2019-10-20 ENCOUNTER — Other Ambulatory Visit: Payer: Medicare Other

## 2019-10-28 ENCOUNTER — Other Ambulatory Visit: Payer: Self-pay | Admitting: Primary Care

## 2019-10-28 DIAGNOSIS — J441 Chronic obstructive pulmonary disease with (acute) exacerbation: Secondary | ICD-10-CM

## 2019-10-28 DIAGNOSIS — J4521 Mild intermittent asthma with (acute) exacerbation: Secondary | ICD-10-CM

## 2019-10-31 DIAGNOSIS — Z79899 Other long term (current) drug therapy: Secondary | ICD-10-CM | POA: Insufficient documentation

## 2019-10-31 NOTE — Progress Notes (Signed)
Virtual Visit via Telephone Note  I attempted to connect with Alisha Roth on 10/31/19 at  9:30 AM EST by telephone   11/01/19 0922 - attempt lm 11/01/19 0935 - attempt, lm  11/01/19 0948 - attempt   Location: Patient: Home Provider: Office - [facility] - [address], [place], [place] [address]   I discussed the limitations, risks, security and privacy concerns of performing an evaluation and management service by telephone and the availability of in person appointments. I also discussed with the patient that there may be a patient responsible charge related to this service. The patient expressed understanding and agreed to proceed.  Patient consented to consult via telephone: Yes People present and their role in pt care: Pt   History of Present Illness:  64 year old female current smoker followed in our office for COPD  Past medical history: Smoking history: Current smoker Maintenance: Patient of Dr. Tonia Brooms  Chief complaint: Televisit, medication management, suspected COPD exacerbation   11/01/19 0922 - attempt lm 11/01/19 0935 - attempt, lm  11/01/19 0948 - attempt   Observations/Objective:  Pulmonary tests:  PFT 10/26/18 >> FEV1 1.34 (60%), FEV1% 68, TLC 4.17 (90%), DLCO 76%, + BD  Sleep tests:  PSG 06/11/18 >> AHI 9.8, SpO2 low 77%  Social History   Tobacco Use  Smoking Status Current Every Day Smoker  . Types: Cigarettes  Smokeless Tobacco Never Used  Tobacco Comment   quit 2016   Immunization History  Administered Date(s) Administered  . Influenza Whole 09/01/2008, 11/09/2009  . Influenza,inj,Quad PF,6+ Mos 06/17/2019  . Influenza-Unspecified 08/03/2018  . Pneumococcal Polysaccharide-23 11/09/2009  . Td 05/16/2018      Assessment and Plan:  No problem-specific Assessment & Plan notes found for this encounter.   Follow Up Instructions:  No follow-ups on file.   I discussed the assessment and treatment plan with the patient. The  patient was provided an opportunity to ask questions and all were answered. The patient agreed with the plan and demonstrated an understanding of the instructions.   The patient was advised to call back or seek an in-person evaluation if the symptoms worsen or if the condition fails to improve as anticipated.  I provided 0 minutes of non-face-to-face time during this encounter.   Coral Ceo, NP

## 2019-11-01 ENCOUNTER — Ambulatory Visit (INDEPENDENT_AMBULATORY_CARE_PROVIDER_SITE_OTHER): Payer: Medicare Other | Admitting: Pulmonary Disease

## 2019-11-01 ENCOUNTER — Encounter: Payer: Self-pay | Admitting: Pulmonary Disease

## 2019-11-01 ENCOUNTER — Other Ambulatory Visit: Payer: Self-pay

## 2019-11-01 DIAGNOSIS — Z79899 Other long term (current) drug therapy: Secondary | ICD-10-CM

## 2019-11-01 DIAGNOSIS — F17200 Nicotine dependence, unspecified, uncomplicated: Secondary | ICD-10-CM

## 2019-11-01 DIAGNOSIS — J309 Allergic rhinitis, unspecified: Secondary | ICD-10-CM

## 2019-11-01 DIAGNOSIS — J449 Chronic obstructive pulmonary disease, unspecified: Secondary | ICD-10-CM

## 2019-11-05 ENCOUNTER — Encounter: Payer: Self-pay | Admitting: Family Medicine

## 2019-11-05 ENCOUNTER — Ambulatory Visit: Payer: Medicare Other | Attending: Family Medicine | Admitting: Family Medicine

## 2019-11-05 DIAGNOSIS — M545 Low back pain, unspecified: Secondary | ICD-10-CM

## 2019-11-05 DIAGNOSIS — Z9119 Patient's noncompliance with other medical treatment and regimen: Secondary | ICD-10-CM

## 2019-11-05 DIAGNOSIS — J449 Chronic obstructive pulmonary disease, unspecified: Secondary | ICD-10-CM

## 2019-11-05 DIAGNOSIS — E119 Type 2 diabetes mellitus without complications: Secondary | ICD-10-CM

## 2019-11-05 DIAGNOSIS — F339 Major depressive disorder, recurrent, unspecified: Secondary | ICD-10-CM

## 2019-11-05 DIAGNOSIS — Z91199 Patient's noncompliance with other medical treatment and regimen due to unspecified reason: Secondary | ICD-10-CM

## 2019-11-05 DIAGNOSIS — Z794 Long term (current) use of insulin: Secondary | ICD-10-CM | POA: Diagnosis not present

## 2019-11-05 MED ORDER — LISINOPRIL 10 MG PO TABS: 10.0000 mg | ORAL_TABLET | Freq: Every day | ORAL | 0 refills | Status: DC

## 2019-11-05 MED ORDER — QUETIAPINE FUMARATE 25 MG PO TABS: 25.0000 mg | ORAL_TABLET | Freq: Every day | ORAL | 0 refills | Status: DC

## 2019-11-05 NOTE — Progress Notes (Signed)
Virtual Visit via Telephone Note  I connected with Alisha Roth  on 11/05/19 at  2:50 PM EST by telephone and verified that I am speaking with the correct person using two identifiers.   I discussed the limitations, risks, security and privacy concerns of performing an evaluation and management service by telephone and the availability of in person appointments. I also discussed with the patient that there may be a patient responsible charge related to this service. The patient expressed understanding and agreed to proceed.  Patient Location: Home Provider Location: [facility] Others participating in call: none   History of Present Illness:        64 year old female, former patient of Joaquin Courts, NP who is no longer at [facility] primary care, and patient with telemedicine visit and follow-up of her chronic medical issues.  She reports that she is currently only on injectable medication for treatment of her diabetes and is no longer on any pills.  She states that her blood sugars are in the 170s to 180s initially but when asked about time of day that blood sugars are monitored, she states that her fasting blood sugars when she checks are generally less than 120.  She denies any hypoglycemic episodes.  She is currently on 22 units of Tresiba once daily and she wonders if this might be too much as she states that she lost a great deal of weight.  When asked about her weight loss, she states that this has occurred a few years ago and is not recent.  She denies increased thirst or urinary frequency.         She states that when the CMA reviewed her medications prior to the visit, that there is some medication she is no longer taking and may would need refills for as the only reason that she is out of the medication is that she ran out of the medication and did not have future refills.  She believes that she does need a new prescription for lisinopril.  She believes that her blood pressure is  controlled on this medication but she has not monitored her blood pressure.  She denies headaches or dizziness related to her blood pressure.           On review of medications, she does not believe that she is still taking Neurontin/gabapentin and she is not sure what the medication is for.  1 medication discussed with the patient, she believes that she may have taken the medication in the past to help with her back pain.  She reports that she has chronic issues with low back pain which radiates into her left hip.  Pain is sharp and is about a 8 on a 0-to-10 scale.  She would like to have a muscle relaxant or other medication to help with her back pain.  She has not had any recent evaluation of her back pain.           She also reports that she is having issues with her Seroquel.  She reports that in the past she was on 600 mg of Seroquel-2 of the 300 mg tablets at night.  She reports however that since she has lost weight, she now only takes " a piece" of one of the 300 mg tablets.  She is worried that the medication will make her too drowsy.  Since she is taking a piece of the medication it does help with sleep but she needs to have a refill of her  Seroquel.  She reports that she does have a mental health provider whom she has not seen recently but she states that she does not need a referral.           She does have some fatigue but she denies any issues with headaches or dizziness, no increased shortness of breath, cough or wheezing, and no chest pain or palpitations.  She denies any urinary frequency or dysuria.  No fever or chills.   Past Medical History:  Diagnosis Date  . Asthma   . COPD (chronic obstructive pulmonary disease) (HCC)   . DM (diabetes mellitus) (HCC)   . Former smoker Quit 2016  . Obesity   . Seasonal allergies     Past Surgical History:  Procedure Laterality Date  . ABDOMINAL HYSTERECTOMY    . CESAREAN SECTION    . LAPAROSCOPIC CHOLECYSTECTOMY      Family History    Problem Relation Age of Onset  . Diabetes Mother   . Heart attack Mother   . Alcohol abuse Father   . Diabetes Sister   . Alcohol abuse Brother     Social History   Tobacco Use  . Smoking status: Current Every Day Smoker    Types: Cigarettes  . Smokeless tobacco: Never Used  . Tobacco comment: quit 2016  Substance Use Topics  . Alcohol use: Never  . Drug use: Never     Allergies  Allergen Reactions  . Metformin And Related Diarrhea       Observations/Objective: No vital signs or physical exam conducted as visit was done via telephone  Assessment and Plan: 1. Type 2 diabetes mellitus without complication, with long-term current use of insulin (HCC) She reports that she is currently taking victoza (possibly at 0.6 mg but she believes it is supposed to be higher and in chart, dose is listed as 1.8 mg daily) and Tresiba at 22 units daily. She reports fasting blood sugars less than 120 and mentioned some blood sugars in the 170's-180's but did not mention the time of day of the higher blood sugars. Last Hgb A1c of 6.8 on review of chart was done in Jan of 2020 and ordered by pulmonology. She has missed prior lab visits for her DM and is not sure of her medication regimen but reports that she is no longer on Jardiance. Orders placed for patient to have labs in follow-up of her DM and will place referral for follow-up with endocrinology. Lisinopril refilled for hypertension and will also obtain urine microalbumin creatinine ratio with upcoming labs.  - lisinopril (ZESTRIL) 10 MG tablet; Take 1 tablet (10 mg total) by mouth daily. Take daily  Dispense: 90 tablet; Refill: 0 - Ambulatory referral to Endocrinology - Comprehensive metabolic panel; Future - Lipid panel; Future - Microalbumin/Creatinine Ratio, Urine; Future - Hemoglobin A1C; Future  2. Low back pain with radiation She has complaint of long-standing issues with low back pain with radiation to the left hip area that has  worsened. Currently taking otc ibuprofen. Offered referrals to Orthopedics and OT as there is no imaging information in the chart and patient preferred Orthopedic referral which was placed.  - AMB referral to orthopedics  3. Stage 2 moderate COPD by GOLD classification (HCC) On review of chart, patient has not been seen by pulmonology since 07/20/2019 visit with Dr. Craige Cotta and was asked to make a 2 week f/u with Dr. Tonia Brooms. She apparently missed her February follow-up appointment there as well.   Referral will be placed  so that patient can be contacted regarding follow-up appointment and for medication refills for her pulmonary issues.  She request muscle relaxant to help with her low back pain but discussed with the patient that Roth to her COPD as well as history of sleep apnea, she should avoid medications that can cause sedation.  Patient also was asked to schedule follow-up with her mental health provider as patient was previously on Seroquel 300 mg, taking 2 pills at nighttime but she reports that since losing weight she now only takes a small piece of one of the 300 mg pills therefore she was given new prescription for Seroquel at 25 mg and is to follow-up with her mental health provider to see if she should continue use of this medication. - Ambulatory referral to Pulmonology  4.  Noncompliance with medical treatment; 5.  Major depression, chronic, recurrent She appears to have issues with compliance with medications as well as follow-up of her chronic medical issues.  She has been asked to come into the office to have blood work done as she has not had a hemoglobin A1c since January 2020 on chart review.  She is also being scheduled to follow-up with pulmonology as she was last seen in early November of last year and was scheduled to have 2-week follow-up which she did not make and then was scheduled for appointment in February which she also did not keep.  She will be referred as well to endocrinology  for continued follow-up of her diabetes.  She is also been asked to schedule follow-up with her mental health provider for continued and evaluation and refill of medications.  She declines need for referral.  Follow Up Instructions:Return for DM-labs in 1-2 weeks.  Will contact patient regarding follow-up appointment after review of upcoming labs.   I discussed the assessment and treatment plan with the patient. The patient was provided an opportunity to ask questions and all were answered. The patient agreed with the plan and demonstrated an understanding of the instructions.   The patient was advised to call back or seek an in-person evaluation if the symptoms worsen or if the condition fails to improve as anticipated.  I provided 26 minutes of non-face-to-face time during this encounter.   Antony Blackbird, MD

## 2019-11-08 ENCOUNTER — Telehealth: Payer: Self-pay | Admitting: Pulmonary Disease

## 2019-11-08 DIAGNOSIS — J441 Chronic obstructive pulmonary disease with (acute) exacerbation: Secondary | ICD-10-CM

## 2019-11-08 MED ORDER — BREZTRI AEROSPHERE 160-9-4.8 MCG/ACT IN AERO: 2.0000 | INHALATION_SPRAY | Freq: Two times a day (BID) | RESPIRATORY_TRACT | 0 refills | Status: DC

## 2019-11-08 NOTE — Telephone Encounter (Signed)
Spoke with the pt  Confirmed that she is supposed to be on Breztri  She does feel that this worked better than the symbicort and spiriva  Rx was refilled pending appt on 11/18/19  She is aware not to use this with symbicort and spiriva  Nothing further needed

## 2019-11-10 ENCOUNTER — Ambulatory Visit: Payer: Medicare Other | Admitting: Pulmonary Disease

## 2019-11-17 ENCOUNTER — Other Ambulatory Visit: Payer: Self-pay

## 2019-11-17 ENCOUNTER — Ambulatory Visit: Payer: Medicare Other | Attending: Family Medicine

## 2019-11-17 DIAGNOSIS — E119 Type 2 diabetes mellitus without complications: Secondary | ICD-10-CM

## 2019-11-18 ENCOUNTER — Encounter: Payer: Self-pay | Admitting: Pulmonary Disease

## 2019-11-18 ENCOUNTER — Ambulatory Visit: Payer: Medicare Other | Admitting: Nurse Practitioner

## 2019-11-18 ENCOUNTER — Ambulatory Visit (INDEPENDENT_AMBULATORY_CARE_PROVIDER_SITE_OTHER): Payer: Medicare Other | Admitting: Pulmonary Disease

## 2019-11-18 VITALS — BP 110/60 | HR 84 | Temp 97.7°F | Ht 62.0 in | Wt 138.6 lb

## 2019-11-18 DIAGNOSIS — J4521 Mild intermittent asthma with (acute) exacerbation: Secondary | ICD-10-CM

## 2019-11-18 DIAGNOSIS — F17200 Nicotine dependence, unspecified, uncomplicated: Secondary | ICD-10-CM

## 2019-11-18 DIAGNOSIS — J441 Chronic obstructive pulmonary disease with (acute) exacerbation: Secondary | ICD-10-CM | POA: Diagnosis not present

## 2019-11-18 LAB — COMPREHENSIVE METABOLIC PANEL WITH GFR
ALT: 15 IU/L (ref 0–32)
AST: 11 IU/L (ref 0–40)
Albumin/Globulin Ratio: 1.9 (ref 1.2–2.2)
Albumin: 4.3 g/dL (ref 3.8–4.8)
Alkaline Phosphatase: 78 IU/L (ref 39–117)
BUN/Creatinine Ratio: 18 (ref 12–28)
BUN: 15 mg/dL (ref 8–27)
Bilirubin Total: 0.5 mg/dL (ref 0.0–1.2)
CO2: 22 mmol/L (ref 20–29)
Calcium: 9.8 mg/dL (ref 8.7–10.3)
Chloride: 103 mmol/L (ref 96–106)
Creatinine, Ser: 0.85 mg/dL (ref 0.57–1.00)
GFR calc Af Amer: 84 mL/min/1.73
GFR calc non Af Amer: 73 mL/min/1.73
Globulin, Total: 2.3 g/dL (ref 1.5–4.5)
Glucose: 219 mg/dL — ABNORMAL HIGH (ref 65–99)
Potassium: 4.4 mmol/L (ref 3.5–5.2)
Sodium: 140 mmol/L (ref 134–144)
Total Protein: 6.6 g/dL (ref 6.0–8.5)

## 2019-11-18 LAB — LIPID PANEL
Chol/HDL Ratio: 2.9 ratio (ref 0.0–4.4)
Cholesterol, Total: 202 mg/dL — ABNORMAL HIGH (ref 100–199)
HDL: 70 mg/dL
LDL Chol Calc (NIH): 112 mg/dL — ABNORMAL HIGH (ref 0–99)
Triglycerides: 115 mg/dL (ref 0–149)
VLDL Cholesterol Cal: 20 mg/dL (ref 5–40)

## 2019-11-18 LAB — HEMOGLOBIN A1C
Est. average glucose Bld gHb Est-mCnc: 206 mg/dL
Hgb A1c MFr Bld: 8.8 % — ABNORMAL HIGH (ref 4.8–5.6)

## 2019-11-18 LAB — MICROALBUMIN / CREATININE URINE RATIO
Creatinine, Urine: 150.7 mg/dL
Microalb/Creat Ratio: 8 mg/g{creat} (ref 0–29)
Microalbumin, Urine: 12.6 ug/mL

## 2019-11-18 MED ORDER — MONTELUKAST SODIUM 10 MG PO TABS: 10.0000 mg | ORAL_TABLET | Freq: Every day | ORAL | 11 refills | Status: DC

## 2019-11-18 MED ORDER — OMEPRAZOLE 40 MG PO CPDR: 40.0000 mg | DELAYED_RELEASE_CAPSULE | Freq: Every day | ORAL | 3 refills | Status: DC

## 2019-11-18 MED ORDER — BUDESONIDE-FORMOTEROL FUMARATE 160-4.5 MCG/ACT IN AERO: 2.0000 | INHALATION_SPRAY | Freq: Two times a day (BID) | RESPIRATORY_TRACT | 0 refills | Status: DC

## 2019-11-18 MED ORDER — MONTELUKAST SODIUM 10 MG PO TABS: 10.0000 mg | ORAL_TABLET | Freq: Every day | ORAL | 3 refills | Status: DC

## 2019-11-18 MED ORDER — SPIRIVA RESPIMAT 2.5 MCG/ACT IN AERS: 2.0000 | INHALATION_SPRAY | Freq: Every day | RESPIRATORY_TRACT | 5 refills | Status: DC

## 2019-11-18 NOTE — Patient Instructions (Signed)
Thank you for visiting Dr. Tonia Brooms at [facility]. Today we recommend the following: Orders Placed This Encounter  Procedures  . Ambulatory Referral for Lung Cancer Scre   Meds ordered this encounter  Medications  . DISCONTD: montelukast (SINGULAIR) 10 MG tablet    Sig: Take 1 tablet (10 mg total) by mouth at bedtime.    Dispense:  90 tablet    Refill:  3  . omeprazole (PRILOSEC) 40 MG capsule    Sig: Take 1 capsule (40 mg total) by mouth daily.    Dispense:  90 capsule    Refill:  3  . montelukast (SINGULAIR) 10 MG tablet    Sig: Take 1 tablet (10 mg total) by mouth at bedtime.    Dispense:  30 tablet    Refill:  11  . budesonide-formoterol (SYMBICORT) 160-4.5 MCG/ACT inhaler    Sig: Inhale 2 puffs into the lungs 2 (two) times daily.    Dispense:  10.2 g    Refill:  0  . Tiotropium Bromide Monohydrate (SPIRIVA RESPIMAT) 2.5 MCG/ACT AERS    Sig: Inhale 2 puffs into the lungs daily.    Dispense:  4 g    Refill:  5   Return in about 6 months (around 05/20/2020) for with APP or Dr. Tonia Brooms.    Please do your part to reduce the spread of COVID-19.

## 2019-11-18 NOTE — Progress Notes (Signed)
Synopsis: Self-referral in December 2019 for history of COPD asthma.  Subjective:   PATIENT ID: Alisha Roth GENDER: female DOB: Jan 08, 1956, MRN: [id number]  Chief Complaint  Patient presents with  . Follow-up    Patient is here to follow up for COPD. Patient feels like she is doing a little better since last visit.     PMH of Asthma diagnosed as child, Former smoker, quit 2016, teenager to 2016, 64 + years, 0.5ppd unless stressed smoked she smoked more. Never had lung cancer screening imagining. Had PFTs within this past year. No current respiratory symptoms. She gets around ok on her own. Walking she sometimes gets out of breath. She was told she had COPD but not sure how bad.  Patient does describe a history of a bad exacerbation this past year.  She was seen by her prior pulmonologist who placed her on azithromycin Monday Wednesday and Friday.  Unfortunately, the patient has zero recollection of any of her prior medical history.  For example she her documentation here at [facility] suggest that she had a laparoscopic cholecystectomy and she has no memory of ever this happening. And most of this has to be probed out of her with questioning.  She is very confused on most of her medications but what I believe she is on currently is Symbicort, Spiriva and albuterol nebulizer as needed for shortness of breath and wheezing.  She also is currently taking an antibiotic Monday Wednesday and Friday which I presume is azithromycin.  She brought none of her medications to the office today and she brought no prior medical documentation either.  Overall we will need to request documentation from her prior pulmonologist as well as her current PCP.  For her current respiratory symptoms.  She does not have any except for a daily cough.  Is nonproductive, no hemoptysis. No wheezing.  OV 11/18/2019: here for follow up. She has been able to cut down to 4 cigarettes daily. She has been trying to use her inhalers regularly.  She was given samples of breztri but this was expensive.  Further questioning on patient's inhaler use makes me question whether or not she is using them appropriately at home or just kind of randomly as needed.  She states though that she does feel better when using her Symbicort.  Unfortunately she is still smoking.  Counseled again today on the importance of smoking cessation.  We also talked about lung cancer screening today and her smoking history.    Past Medical History:  Diagnosis Date  . Asthma   . COPD (chronic obstructive pulmonary disease) ([place])   . DM (diabetes mellitus) ([place])   . Former smoker Quit 2016  . Obesity   . Seasonal allergies      Family History  Problem Relation Age of Onset  . Diabetes Mother   . Heart attack Mother   . Alcohol abuse Father   . Diabetes Sister   . Alcohol abuse Brother      Past Surgical History:  Procedure Laterality Date  . ABDOMINAL HYSTERECTOMY    . CESAREAN SECTION    . LAPAROSCOPIC CHOLECYSTECTOMY      Social History   Socioeconomic History  . Marital status: Single    Spouse name: Not on file  . Number of children: Not on file  . Years of education: Not on file  . Highest education level: Not on file  Occupational History  . Not on file  Tobacco Use  . Smoking status: Current  Every Day Smoker    Packs/day: 0.25    Types: Cigarettes  . Smokeless tobacco: Never Used  . Tobacco comment: 4-5 cigarettes a day  Substance and Sexual Activity  . Alcohol use: Never  . Drug use: Never  . Sexual activity: Yes  Other Topics Concern  . Not on file  Social History Narrative  . Not on file   Social Determinants of Health   Financial Resource Strain:   . Difficulty of Paying Living Expenses: Not on file  Food Insecurity:   . Worried About Charity fundraiser in the Last Year: Not on file  . Ran Out of Food in the Last Year: Not on file  Transportation Needs:   . Lack of Transportation (Medical): Not on file  . Lack of  Transportation (Non-Medical): Not on file  Physical Activity:   . Days of Exercise per Week: Not on file  . Minutes of Exercise per Session: Not on file  Stress:   . Feeling of Stress : Not on file  Social Connections:   . Frequency of Communication with Friends and Family: Not on file  . Frequency of Social Gatherings with Friends and Family: Not on file  . Attends Religious Services: Not on file  . Active Member of Clubs or Organizations: Not on file  . Attends Archivist Meetings: Not on file  . Marital Status: Not on file  Intimate Partner Violence:   . Fear of Current or Ex-Partner: Not on file  . Emotionally Abused: Not on file  . Physically Abused: Not on file  . Sexually Abused: Not on file     Allergies  Allergen Reactions  . Metformin And Related Diarrhea     Outpatient Medications Prior to Visit  Medication Sig Dispense Refill  . Accu-Chek FastClix Lancets MISC Use to check blood sugars up to 4 times per day 120 each prn  . ACCU-CHEK GUIDE test strip USE AS DIRECTED UP TO 4 TIMES DAILY. DX: E11.9, Z79.4 200 each 1  . albuterol (PROVENTIL HFA) 108 (90 Base) MCG/ACT inhaler Inhale 2 puffs into the lungs every 6 (six) hours as needed for wheezing or shortness of breath. 6.7 g 5  . albuterol (PROVENTIL) (2.5 MG/3ML) 0.083% nebulizer solution Take 3 mLs (2.5 mg total) by nebulization every 6 (six) hours as needed for wheezing or shortness of breath. 75 mL 6  . B-D UF III MINI PEN NEEDLES 31G X 5 MM MISC Use 1 pen to inject insulin as prescribed daily.  E11.9 Type 2 diabetes 200 each 3  . Blood Glucose Monitoring Suppl (ACCU-CHEK GUIDE) w/Device KIT 1 kit by Does not apply route 4 (four) times daily as needed. To monitor blood sugars 1 kit 0  . Budeson-Glycopyrrol-Formoterol (BREZTRI AEROSPHERE) 160-9-4.8 MCG/ACT AERO Inhale 2 puffs into the lungs 2 (two) times daily. 5.9 g 0  . budesonide-formoterol (SYMBICORT) 160-4.5 MCG/ACT inhaler Inhale 2 puffs into the lungs 2  (two) times daily. 10.2 g 0  . busPIRone (BUSPAR) 10 MG tablet Take 10 mg by mouth 2 (two) times daily as needed.    . cetirizine (ZYRTEC) 10 MG tablet Take 1 tablet (10 mg total) by mouth daily. 60 tablet 3  . diclofenac sodium (VOLTAREN) 1 % GEL Apply 4 g topically 4 (four) times daily. 100 g 0  . DULoxetine (CYMBALTA) 60 MG capsule Take 1 capsule by mouth 2 (two) times daily.    Marland Kitchen liraglutide (VICTOZA) 18 MG/3ML SOPN Inject 0.3 mLs (1.8 mg  total) into the skin daily. Takes in daily morning 3 mL 3  . lisinopril (ZESTRIL) 10 MG tablet Take 1 tablet (10 mg total) by mouth daily. Take daily 90 tablet 0  . LORazepam (ATIVAN) 0.5 MG tablet Take 0.5 mg by mouth every 8 (eight) hours as needed for anxiety. PCP will not provide refills.    . QUEtiapine (SEROQUEL) 25 MG tablet Take 1 tablet (25 mg total) by mouth at bedtime. 30 tablet 0  . SPIRIVA HANDIHALER 18 MCG inhalation capsule Place 1 capsule (18 mcg total) into inhaler and inhale daily. 90 capsule 3  . TRESIBA FLEXTOUCH 100 UNIT/ML SOPN FlexTouch Pen INJECT 20 UNITS INTO THE SKIN DAILY. 15 mL 3  . valACYclovir (VALTREX) 500 MG tablet Take 1 tablet (500 mg total) by mouth daily. 90 tablet 3  . montelukast (SINGULAIR) 10 MG tablet Take by mouth.    Marland Kitchen omeprazole (PRILOSEC) 40 MG capsule Take by mouth.     No facility-administered medications prior to visit.    This medication as listed above is likely inaccurate.  I have no medication list to go by.  And the patient is completely unreliable based upon her history taking.  Review of Systems  Constitutional: Negative for chills, fever, malaise/fatigue and weight loss.  HENT: Negative for hearing loss, sore throat and tinnitus.   Eyes: Negative for blurred vision and double vision.  Respiratory: Positive for cough and shortness of breath. Negative for hemoptysis, sputum production, wheezing and stridor.   Cardiovascular: Negative for chest pain, palpitations, orthopnea, leg swelling and PND.   Gastrointestinal: Negative for abdominal pain, constipation, diarrhea, heartburn, nausea and vomiting.  Genitourinary: Negative for dysuria, hematuria and urgency.  Musculoskeletal: Negative for joint pain and myalgias.  Skin: Negative for itching and rash.  Neurological: Negative for dizziness, tingling, weakness and headaches.  Endo/Heme/Allergies: Negative for environmental allergies. Does not bruise/bleed easily.  Psychiatric/Behavioral: Negative for depression. The patient is not nervous/anxious and does not have insomnia.   All other systems reviewed and are negative.    Objective:  Physical Exam Vitals reviewed.  Constitutional:      General: She is not in acute distress.    Appearance: She is well-developed.  HENT:     Head: Normocephalic and atraumatic.     Mouth/Throat:     Pharynx: No oropharyngeal exudate.  Eyes:     Conjunctiva/sclera: Conjunctivae normal.     Pupils: Pupils are equal, round, and reactive to light.  Neck:     Vascular: No JVD.     Trachea: No tracheal deviation.     Comments: Loss of supraclavicular fat Cardiovascular:     Rate and Rhythm: Normal rate and regular rhythm.     Heart sounds: S1 normal and S2 normal.     Comments: Distant heart tones Pulmonary:     Effort: No tachypnea or accessory muscle usage.     Breath sounds: No stridor. Decreased breath sounds (throughout all lung fields) present. No wheezing, rhonchi or rales.  Abdominal:     General: Bowel sounds are normal. There is no distension.     Palpations: Abdomen is soft.     Tenderness: There is no abdominal tenderness.  Musculoskeletal:        General: Deformity (muscle wasting ) present.  Skin:    General: Skin is warm and dry.     Capillary Refill: Capillary refill takes less than 2 seconds.     Findings: No rash.  Neurological:     Mental Status:  She is alert and oriented to person, place, and time.  Psychiatric:        Behavior: Behavior normal.      Vitals:    11/18/19 1149  BP: 110/60  Pulse: 84  Temp: 97.7 F (36.5 C)  TempSrc: Temporal  SpO2: 93%  Weight: 138 lb 9.6 oz (62.9 kg)  Height: '5\' 2"'  (1.575 m)   93% on RA BMI Readings from Last 3 Encounters:  11/18/19 25.35 kg/m  07/20/19 28.80 kg/m  05/30/19 28.72 kg/m   Wt Readings from Last 3 Encounters:  11/18/19 138 lb 9.6 oz (62.9 kg)  07/20/19 152 lb 6.4 oz (69.1 kg)  05/30/19 152 lb (68.9 kg)     CBC    Component Value Date/Time   WBC 11.8 (H) 03/31/2019 1208   RBC 4.56 03/31/2019 1208   HGB 13.5 03/31/2019 1208   HGB 11.8 10/05/2018 0953   HCT 42.4 03/31/2019 1208   HCT 35.9 10/05/2018 0953   PLT 246 03/31/2019 1208   PLT 313 10/05/2018 0953   MCV 93.0 03/31/2019 1208   MCV 86 10/05/2018 0953   MCH 29.6 03/31/2019 1208   MCHC 31.8 03/31/2019 1208   RDW 15.0 03/31/2019 1208   RDW 13.5 10/05/2018 0953   LYMPHSABS 1.3 03/31/2019 1208   LYMPHSABS 1.8 10/05/2018 0953   MONOABS 0.5 03/31/2019 1208   EOSABS 0.1 03/31/2019 1208   EOSABS 0.1 10/05/2018 0953   BASOSABS 0.0 03/31/2019 1208   BASOSABS 0.0 10/05/2018 0953    Chest Imaging: No prior chest imaging for review  Pulmonary Functions Testing Results: No prior documented PFTs  FeNO: None   Pathology: None   Echocardiogram: None   Heart Catheterization: None     Assessment & Plan:   Smoker - Plan: Ambulatory Referral for Lung Cancer Scre  COPD with exacerbation ([place]) - Plan: budesonide-formoterol (SYMBICORT) 160-4.5 MCG/ACT inhaler  Mild intermittent asthma with exacerbation - Plan: budesonide-formoterol (SYMBICORT) 160-4.5 MCG/ACT inhaler  Discussion: This is a 64 year old female longstanding history of smoking.  Unfortunately she is still doing such.  Counseled again on the importance of smoking cessation.  Currently managed with Symbicort and Spiriva HandiHaler.  Diagnosed originally with COPD from a prior pulmonologist in [place].  She also has significant seasonal allergies.  Currently on  Singulair.  Today in the office we discussed the importance of smoking cessation.  Patient was counseled on smoking cessation again. Patient was also counseled on lung cancer screening program.  She has agreed to consider this and have a shared decision-making visit.  Plan: Referral placed to the lung cancer screening program. Refills for Symbicort and Spiriva Respimat were given. New prescription for prescribed Respimat today. Stop use of Spiriva HandiHaler. Patient also needs samples to cover her until she can afford to pick up her medications from the pharmacy. She is interested in trialing Trelegy inhaler. We will give her samples of this.  In the meantime we will refer to our pharmacist in clinic for medication reconciliation inhaler training and smoking cessation.  Greater than 50% of this patient's 30-minute office visit was been face-to-face discussing above recommendations treatment plan.  Review of inhalers.  Smoking cessation.   Current Outpatient Medications:  .  Accu-Chek FastClix Lancets MISC, Use to check blood sugars up to 4 times per day, Disp: 120 each, Rfl: prn .  ACCU-CHEK GUIDE test strip, USE AS DIRECTED UP TO 4 TIMES DAILY. DX: E11.9, Z79.4, Disp: 200 each, Rfl: 1 .  albuterol (PROVENTIL HFA) 108 (90 Base)  MCG/ACT inhaler, Inhale 2 puffs into the lungs every 6 (six) hours as needed for wheezing or shortness of breath., Disp: 6.7 g, Rfl: 5 .  albuterol (PROVENTIL) (2.5 MG/3ML) 0.083% nebulizer solution, Take 3 mLs (2.5 mg total) by nebulization every 6 (six) hours as needed for wheezing or shortness of breath., Disp: 75 mL, Rfl: 6 .  B-D UF III MINI PEN NEEDLES 31G X 5 MM MISC, Use 1 pen to inject insulin as prescribed daily.  E11.9 Type 2 diabetes, Disp: 200 each, Rfl: 3 .  Blood Glucose Monitoring Suppl (ACCU-CHEK GUIDE) w/Device KIT, 1 kit by Does not apply route 4 (four) times daily as needed. To monitor blood sugars, Disp: 1 kit, Rfl: 0 .  budesonide-formoterol  (SYMBICORT) 160-4.5 MCG/ACT inhaler, Inhale 2 puffs into the lungs 2 (two) times daily., Disp: 10.2 g, Rfl: 0 .  busPIRone (BUSPAR) 10 MG tablet, Take 10 mg by mouth 2 (two) times daily as needed., Disp: , Rfl:  .  cetirizine (ZYRTEC) 10 MG tablet, Take 1 tablet (10 mg total) by mouth daily., Disp: 60 tablet, Rfl: 3 .  diclofenac sodium (VOLTAREN) 1 % GEL, Apply 4 g topically 4 (four) times daily., Disp: 100 g, Rfl: 0 .  DULoxetine (CYMBALTA) 60 MG capsule, Take 1 capsule by mouth 2 (two) times daily., Disp: , Rfl:  .  liraglutide (VICTOZA) 18 MG/3ML SOPN, Inject 0.3 mLs (1.8 mg total) into the skin daily. Takes in daily morning, Disp: 3 mL, Rfl: 3 .  lisinopril (ZESTRIL) 10 MG tablet, Take 1 tablet (10 mg total) by mouth daily. Take daily, Disp: 90 tablet, Rfl: 0 .  LORazepam (ATIVAN) 0.5 MG tablet, Take 0.5 mg by mouth every 8 (eight) hours as needed for anxiety. PCP will not provide refills., Disp: , Rfl:  .  montelukast (SINGULAIR) 10 MG tablet, Take 1 tablet (10 mg total) by mouth at bedtime., Disp: 30 tablet, Rfl: 11 .  omeprazole (PRILOSEC) 40 MG capsule, Take 1 capsule (40 mg total) by mouth daily., Disp: 90 capsule, Rfl: 3 .  QUEtiapine (SEROQUEL) 25 MG tablet, Take 1 tablet (25 mg total) by mouth at bedtime., Disp: 30 tablet, Rfl: 0 .  Tiotropium Bromide Monohydrate (SPIRIVA RESPIMAT) 2.5 MCG/ACT AERS, Inhale 2 puffs into the lungs daily., Disp: 4 g, Rfl: 5 .  TRESIBA FLEXTOUCH 100 UNIT/ML SOPN FlexTouch Pen, INJECT 20 UNITS INTO THE SKIN DAILY., Disp: 15 mL, Rfl: 3 .  valACYclovir (VALTREX) 500 MG tablet, Take 1 tablet (500 mg total) by mouth daily., Disp: 90 tablet, Rfl: 3    Garner Nash, DO [facility] 11/18/2019 11:59 AM

## 2019-11-22 ENCOUNTER — Telehealth: Payer: Self-pay | Admitting: Family Medicine

## 2019-11-22 ENCOUNTER — Other Ambulatory Visit: Payer: Self-pay | Admitting: Family Medicine

## 2019-11-22 DIAGNOSIS — E1169 Type 2 diabetes mellitus with other specified complication: Secondary | ICD-10-CM

## 2019-11-22 DIAGNOSIS — E785 Hyperlipidemia, unspecified: Secondary | ICD-10-CM

## 2019-11-22 DIAGNOSIS — Z79899 Other long term (current) drug therapy: Secondary | ICD-10-CM

## 2019-11-22 MED ORDER — ROSUVASTATIN CALCIUM 10 MG PO TABS: 10.0000 mg | ORAL_TABLET | Freq: Every day | ORAL | 1 refills | Status: DC

## 2019-11-22 NOTE — Telephone Encounter (Signed)
Please see lab section as well as staff messages.  Prescription has been sent into patient's pharmacy for cholesterol medication and she also has a referral in place to follow-up with endocrinology.  Please check with Arna Medici to see if referral has been made so that you could also notify patient.  Her hemoglobin A1c was 8.8 with goal A1c of 7 or less.

## 2019-11-22 NOTE — Telephone Encounter (Signed)
I see that her labs are available but I am not sure how to review them as I am not sure if they were sent directly to me.  I will look at them and send you the information to contact the patient.

## 2019-11-22 NOTE — Progress Notes (Signed)
Patient ID: Alisha Roth, female   DOB: 03-16-1956, 64 y.o.   MRN: [id number]    Patient with recent lab visit on 11/17/2019 and hemoglobin A1c was elevated at 8.8 with goal of 7 or less, normal creatinine/microalbumin ratio, normal comprehensive metabolic panel other than glucose of 219.  Lipid panel with LDL of 112.  She will be contacted with the results by nursing and also will be notified that prescription has been sent into her pharmacy to start cholesterol medicine, rosuvastatin 10 mg daily to help reduce the risk of heart disease associated with being diabetic.  She is also had recent referral placed to endocrinology for further follow-up of her diabetes.

## 2019-11-22 NOTE — Telephone Encounter (Signed)
Patient wants a call from you to explain lab results asap.

## 2019-11-23 ENCOUNTER — Other Ambulatory Visit: Payer: Self-pay | Admitting: *Deleted

## 2019-11-23 DIAGNOSIS — Z87891 Personal history of nicotine dependence: Secondary | ICD-10-CM

## 2019-11-23 DIAGNOSIS — F17210 Nicotine dependence, cigarettes, uncomplicated: Secondary | ICD-10-CM

## 2019-11-23 NOTE — Telephone Encounter (Signed)
Patient called and requested lab results. Patient was informed of most recent lab results. Patient verbalized understanding and had no further questions or concerns.  

## 2019-11-24 ENCOUNTER — Ambulatory Visit: Payer: Self-pay

## 2019-11-24 ENCOUNTER — Ambulatory Visit (INDEPENDENT_AMBULATORY_CARE_PROVIDER_SITE_OTHER): Payer: Medicare Other | Admitting: Family Medicine

## 2019-11-24 ENCOUNTER — Other Ambulatory Visit: Payer: Self-pay

## 2019-11-24 ENCOUNTER — Encounter: Payer: Self-pay | Admitting: Family Medicine

## 2019-11-24 DIAGNOSIS — M5134 Other intervertebral disc degeneration, thoracic region: Secondary | ICD-10-CM | POA: Diagnosis not present

## 2019-11-24 DIAGNOSIS — M545 Low back pain: Secondary | ICD-10-CM | POA: Diagnosis not present

## 2019-11-24 DIAGNOSIS — G8929 Other chronic pain: Secondary | ICD-10-CM

## 2019-11-24 DIAGNOSIS — M5136 Other intervertebral disc degeneration, lumbar region: Secondary | ICD-10-CM | POA: Diagnosis not present

## 2019-11-24 DIAGNOSIS — M546 Pain in thoracic spine: Secondary | ICD-10-CM | POA: Diagnosis not present

## 2019-11-24 MED ORDER — TRAMADOL HCL 50 MG PO TABS: 50.0000 mg | ORAL_TABLET | Freq: Every evening | ORAL | 0 refills | Status: DC | PRN

## 2019-11-24 MED ORDER — BACLOFEN 10 MG PO TABS: 5.0000 mg | ORAL_TABLET | Freq: Three times a day (TID) | ORAL | 3 refills | Status: AC | PRN

## 2019-11-24 MED ORDER — VITAMIN K2 100 MCG PO TABS: 100.0000 ug | ORAL_TABLET | Freq: Every day | ORAL | 3 refills | Status: DC

## 2019-11-24 MED ORDER — MAGNESIUM 400 MG PO CAPS: 400.0000 mg | ORAL_CAPSULE | Freq: Every day | ORAL | 1 refills | Status: DC

## 2019-11-24 MED ORDER — VITAMIN D-3 125 MCG (5000 UT) PO TABS: 1.0000 | ORAL_TABLET | Freq: Every day | ORAL | 3 refills | Status: AC

## 2019-11-24 NOTE — Progress Notes (Deleted)
    SUBJECTIVE:   CHIEF COMPLAINT / HPI:   Back Pain  Location: whole back. Has been going on for about three years. Unsure about what sparked pain. No trauma to accidents in the past. Constant pain present when she wakes up in the morning. Takes ibuprofen with mild relief. Everything makes it hurt worse. Denies bowel and bladder incontinence, saddle anesthesia, and no weakness in her legs.   PERTINENT  PMH / PSH: COPD  OBJECTIVE:   There were no vitals taken for this visit.  ***  ASSESSMENT/PLAN:   No problem-specific Assessment & Plan notes found for this encounter.     Melene Plan, MD [facility]

## 2019-11-24 NOTE — Progress Notes (Signed)
 This encounter was created in error - please disregard.

## 2019-11-24 NOTE — Progress Notes (Signed)
Office Visit Note   Patient: Alisha Roth           Date of Birth: [birth year]           MRN: [id number] Visit Date: 11/24/2019 Requested by: Cain Saupe, MD [address] [place],  [place] [address] PCP: Cain Saupe, MD  Subjective: Chief Complaint  Patient presents with  . Lower Back - Pain    Constant pain in the back x years. NKI. Legs ache occasionally. No numbness/tingling. No issues with incontinence.  . Middle Back - Pain    HPI: She is here with thoracic and lumbar back pain.  No injury, symptoms started about 3 years ago.  Pain in the midline thoracolumbar spine constantly, even when lying down to sleep.  She has tried a couple different medications with no improvement.  No radicular symptoms, no fevers or chills, no incontinence, no unintentional weight change.  No history of compression fractures or osteoporosis.  She has diabetes which is poorly controlled, and she smokes cigarettes but is trying to quit.              ROS:   All other systems were reviewed and are negative.  Objective: Vital Signs: There were no vitals taken for this visit.  Physical Exam:  General:  Alert and oriented, in no acute distress. Pulm:  Breathing unlabored. Psy:  Normal mood, congruent affect. Skin: No visible rash. Back: She has no scoliosis.  She has diffuse tenderness over the thoracic and lumbar spinous processes.  There is some paraspinous muscle tenderness as well.  No pain in the sciatic notch, upper and lower extremity strength and reflexes are normal.  Imaging: X-rays thoracic and lumbar spine: She has mild degenerative disc disease at several levels in the thoracic and L2-3 and L3-4 in the lumbar spine.  No compression deformities, no sign of neoplasm.  She has some calcification of her abdominal aorta.  Hip joints look normal.  Assessment & Plan: 1.  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 -Elected to start  physical therapy.  Encouraged her to quit smoking.  Baclofen as needed, tramadol only for severe pain. -Start taking vitamin D3, vitamin K2 and magnesium for bone health. -Consider MRI scan if she fails to improve.     Procedures: No procedures performed  No notes on file     PMFS History: Patient Active Problem List   Diagnosis Date Noted  . Medication management 10/31/2019  . Stage 2 moderate COPD by GOLD classification (HCC) 10/26/2018  . SHOULDER PAIN, LEFT 07/13/2009  . TOBACCO ABUSE 12/26/2008  . SCABIES 09/01/2008  . 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 HEADACHE 10/20/2007  . DEPRESSION 05/25/2007  . ASTHMA 05/25/2007  . MENOPAUSAL SYNDROME 05/25/2007  . PLANTAR FASCITIS 12/04/2005  . Allergic rhinitis 11/20/2005   Past Medical History:  Diagnosis Date  . Asthma   . COPD (chronic obstructive pulmonary disease) (HCC)   . DM (diabetes mellitus) ([facility])   . Former smoker Quit 2016  . Obesity   . Seasonal allergies     Family History  Problem Relation Age of Onset  . Diabetes Mother   . Heart attack Mother   . Alcohol abuse Father   . Diabetes Sister   . Alcohol abuse Brother     Past Surgical History:  Procedure Laterality Date  . ABDOMINAL HYSTERECTOMY    . CESAREAN SECTION    . LAPAROSCOPIC CHOLECYSTECTOMY     Social History  Occupational History  . Not on file  Tobacco Use  . Smoking status: Current Every Day Smoker    Packs/day: 0.25    Types: Cigarettes  . Smokeless tobacco: Never Used  . Tobacco comment: 4-5 cigarettes a day  Substance and Sexual Activity  . Alcohol use: Never  . Drug use: Never  . Sexual activity: Yes

## 2019-11-25 ENCOUNTER — Telehealth: Payer: Self-pay | Admitting: Pulmonary Disease

## 2019-11-25 NOTE — Telephone Encounter (Signed)
Alisha Roth spoke to [place] and addresses this message. Will close encounter, nothing further is needed.

## 2019-11-27 ENCOUNTER — Other Ambulatory Visit: Payer: Self-pay

## 2019-11-27 ENCOUNTER — Emergency Department
Admission: EM | Admit: 2019-11-27 | Discharge: 2019-11-27 | Disposition: A | Discharge status: Home / Self Care | Payer: Medicare Other | Attending: Emergency Medicine | Admitting: Emergency Medicine

## 2019-11-27 DIAGNOSIS — W260XXA Contact with knife, initial encounter: Secondary | ICD-10-CM | POA: Diagnosis not present

## 2019-11-27 DIAGNOSIS — F17210 Nicotine dependence, cigarettes, uncomplicated: Secondary | ICD-10-CM | POA: Insufficient documentation

## 2019-11-27 DIAGNOSIS — Y929 Unspecified place or not applicable: Secondary | ICD-10-CM | POA: Insufficient documentation

## 2019-11-27 DIAGNOSIS — Z9049 Acquired absence of other specified parts of digestive tract: Secondary | ICD-10-CM | POA: Insufficient documentation

## 2019-11-27 DIAGNOSIS — J449 Chronic obstructive pulmonary disease, unspecified: Secondary | ICD-10-CM | POA: Insufficient documentation

## 2019-11-27 DIAGNOSIS — Y9389 Activity, other specified: Secondary | ICD-10-CM | POA: Diagnosis not present

## 2019-11-27 DIAGNOSIS — J45909 Unspecified asthma, uncomplicated: Secondary | ICD-10-CM | POA: Insufficient documentation

## 2019-11-27 DIAGNOSIS — E119 Type 2 diabetes mellitus without complications: Secondary | ICD-10-CM | POA: Diagnosis not present

## 2019-11-27 DIAGNOSIS — S0502XA Injury of conjunctiva and corneal abrasion without foreign body, left eye, initial encounter: Secondary | ICD-10-CM | POA: Diagnosis not present

## 2019-11-27 DIAGNOSIS — Z794 Long term (current) use of insulin: Secondary | ICD-10-CM | POA: Diagnosis not present

## 2019-11-27 DIAGNOSIS — Y999 Unspecified external cause status: Secondary | ICD-10-CM | POA: Insufficient documentation

## 2019-11-27 DIAGNOSIS — S0592XA Unspecified injury of left eye and orbit, initial encounter: Secondary | ICD-10-CM | POA: Diagnosis present

## 2019-11-27 DIAGNOSIS — Z79899 Other long term (current) drug therapy: Secondary | ICD-10-CM | POA: Insufficient documentation

## 2019-11-27 MED ORDER — FLUORESCEIN SODIUM 1 MG OP STRP
1.0000 | ORAL_STRIP | Freq: Once | OPHTHALMIC | Status: AC
  Administered 2019-11-27: 1 via OPHTHALMIC
  Filled 2019-11-27: qty 1

## 2019-11-27 MED ORDER — TETRACAINE HCL 0.5 % OP SOLN
2.0000 [drp] | Freq: Once | OPHTHALMIC | Status: AC
  Administered 2019-11-27: 2 [drp] via OPHTHALMIC
  Filled 2019-11-27: qty 4

## 2019-11-27 MED ORDER — OXYCODONE-ACETAMINOPHEN 5-325 MG PO TABS
1.0000 | ORAL_TABLET | Freq: Once | ORAL | Status: AC
  Administered 2019-11-27: 1 via ORAL
  Filled 2019-11-27: qty 1

## 2019-11-27 MED ORDER — ERYTHROMYCIN 5 MG/GM OP OINT
TOPICAL_OINTMENT | Freq: Once | OPHTHALMIC | Status: AC
  Administered 2019-11-27: 1 via OPHTHALMIC
  Filled 2019-11-27: qty 1

## 2019-11-27 MED ORDER — EYE WASH OPHTH SOLN
1.0000 [drp] | OPHTHALMIC | Status: DC | PRN
  Filled 2019-11-27: qty 118

## 2019-11-27 MED ORDER — HYDROXYZINE HCL 50 MG PO TABS: 50.0000 mg | ORAL_TABLET | Freq: Three times a day (TID) | ORAL | 0 refills | Status: DC | PRN

## 2019-11-27 MED ORDER — HYDROXYZINE HCL 50 MG PO TABS
50.0000 mg | ORAL_TABLET | Freq: Once | ORAL | Status: AC
  Administered 2019-11-27: 50 mg via ORAL
  Filled 2019-11-27: qty 1

## 2019-11-27 MED ORDER — ERYTHROMYCIN 5 MG/GM OP OINT: 1.0000 "application " | TOPICAL_OINTMENT | Freq: Four times a day (QID) | OPHTHALMIC | 0 refills | Status: DC

## 2019-11-27 MED ORDER — OXYCODONE-ACETAMINOPHEN 5-325 MG PO TABS: 1.0000 | ORAL_TABLET | ORAL | 0 refills | Status: AC | PRN

## 2019-11-27 NOTE — ED Notes (Signed)
Provider given erythromycin ointment. Provider at bedside.

## 2019-11-27 NOTE — ED Notes (Signed)
Provider Smith at bedside.

## 2019-11-27 NOTE — ED Provider Notes (Signed)
[facility] Emergency Department Provider Note   ____________________________________________   First MD Initiated Contact with Patient 11/27/19 1139     (approximate)  I have reviewed the triage vital signs and the nursing notes.   HISTORY  Chief Complaint Eye Pain    HPI Alisha Roth is a 64 y.o. female patient presents with left arm pain secondary to being poked by blunt knife while cleaning hair from a vacuum cleaner.  Incident occurred prior to arrival.  Patient denies vision loss.  Patient rates pain as a 10/10.  Patient described the pain as "sore".  No palliative measures prior to arrival.         Past Medical History:  Diagnosis Date  . Asthma   . COPD (chronic obstructive pulmonary disease) ([place])   . DM (diabetes mellitus) ([place])   . Former smoker Quit 2016  . Obesity   . Seasonal allergies     Patient Active Problem List   Diagnosis Date Noted  . Medication management 10/31/2019  . Stage 2 moderate COPD by GOLD classification ([place]) 10/26/2018  . SHOULDER PAIN, LEFT 07/13/2009  . TOBACCO ABUSE 12/26/2008  . SCABIES 09/01/2008  . 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 HEADACHE 10/20/2007  . DEPRESSION 05/25/2007  . ASTHMA 05/25/2007  . MENOPAUSAL SYNDROME 05/25/2007  . PLANTAR FASCITIS 12/04/2005  . Allergic rhinitis 11/20/2005    Past Surgical History:  Procedure Laterality Date  . ABDOMINAL HYSTERECTOMY    . CESAREAN SECTION    . LAPAROSCOPIC CHOLECYSTECTOMY      Prior to Admission medications   Medication Sig Start Date End Date Taking? Authorizing Provider  Accu-Chek FastClix Lancets MISC Use to check blood sugars up to 4 times per day 07/19/19   Fulp, Cammie, MD  ACCU-CHEK GUIDE test strip USE AS DIRECTED UP TO 4 TIMES DAILY. DX: E11.9, Z79.4 07/19/19   Fulp, Cammie, MD  albuterol (PROVENTIL HFA) 108 (90 Base) MCG/ACT inhaler Inhale 2 puffs into the lungs every 6 (six) hours as needed for wheezing or shortness of  breath. 07/20/19   Chesley Mires, MD  albuterol (PROVENTIL) (2.5 MG/3ML) 0.083% nebulizer solution Take 3 mLs (2.5 mg total) by nebulization every 6 (six) hours as needed for wheezing or shortness of breath. 10/26/18   [name], NP  B-D UF III MINI PEN NEEDLES 31G X 5 MM MISC Use 1 pen to inject insulin as prescribed daily.  E11.9 Type 2 diabetes 11/23/18   [name], FNP  baclofen (LIORESAL) 10 MG tablet Take 0.5-1 tablets (5-10 mg total) by mouth 3 (three) times daily as needed for muscle spasms. 11/24/19   Hilts, Legrand Como, MD  Blood Glucose Monitoring Suppl (ACCU-CHEK GUIDE) w/Device KIT 1 kit by Does not apply route 4 (four) times daily as needed. To monitor blood sugars 07/19/19   Fulp, Cammie, MD  budesonide-formoterol (SYMBICORT) 160-4.5 MCG/ACT inhaler Inhale 2 puffs into the lungs 2 (two) times daily. 11/18/19   Icard, Octavio Graves, DO  busPIRone (BUSPAR) 10 MG tablet Take 10 mg by mouth 2 (two) times daily as needed.    [provider]  cetirizine (ZYRTEC) 10 MG tablet Take 1 tablet (10 mg total) by mouth daily. 11/23/18   [name], FNP  Cholecalciferol (VITAMIN D-3) 125 MCG (5000 UT) TABS Take 1 tablet by mouth daily. 11/24/19   Hilts, Legrand Como, MD  diclofenac sodium (VOLTAREN) 1 % GEL Apply 4 g topically 4 (four) times daily. 10/22/18   [name], FNP  DULoxetine (  CYMBALTA) 60 MG capsule Take 1 capsule by mouth 2 (two) times daily. 09/28/18   [provider]  erythromycin ophthalmic ointment Place 1 application into the left eye 4 (four) times daily. 11/27/19   Sable Feil, PA-C  hydrOXYzine (ATARAX/VISTARIL) 50 MG tablet Take 1 tablet (50 mg total) by mouth 3 (three) times daily as needed for itching. 11/27/19   Sable Feil, PA-C  liraglutide (VICTOZA) 18 MG/3ML SOPN Inject 0.3 mLs (1.8 mg total) into the skin daily. Takes in daily morning 11/23/18   [name], FNP  lisinopril (ZESTRIL) 10 MG tablet Take 1 tablet (10 mg total) by mouth  daily. Take daily 11/05/19   Fulp, Cammie, MD  LORazepam (ATIVAN) 0.5 MG tablet Take 0.5 mg by mouth every 8 (eight) hours as needed for anxiety. PCP will not provide refills.    [provider]  Magnesium 400 MG CAPS Take 400 mg by mouth daily. 11/24/19   Hilts, Legrand Como, MD  Menatetrenone (VITAMIN K2) 100 MCG TABS Take 100 mcg by mouth daily. 11/24/19   Hilts, Legrand Como, MD  montelukast (SINGULAIR) 10 MG tablet Take 1 tablet (10 mg total) by mouth at bedtime. 11/18/19   Icard, Octavio Graves, DO  omeprazole (PRILOSEC) 40 MG capsule Take 1 capsule (40 mg total) by mouth daily. 11/18/19   Icard, Octavio Graves, DO  oxyCODONE-acetaminophen (PERCOCET) 5-325 MG tablet Take 1 tablet by mouth every 4 (four) hours as needed for severe pain. 11/27/19 11/26/20  Sable Feil, PA-C  QUEtiapine (SEROQUEL) 25 MG tablet Take 1 tablet (25 mg total) by mouth at bedtime. 11/05/19   Fulp, Cammie, MD  rosuvastatin (CRESTOR) 10 MG tablet Take 1 tablet (10 mg total) by mouth daily. To lower cholesterol 11/22/19   Fulp, Cammie, MD  Tiotropium Bromide Monohydrate (SPIRIVA RESPIMAT) 2.5 MCG/ACT AERS Inhale 2 puffs into the lungs daily. 11/18/19   Icard, Octavio Graves, DO  traMADol (ULTRAM) 50 MG tablet Take 1-2 tablets (50-100 mg total) by mouth at bedtime as needed. 11/24/19   Hilts, Legrand Como, MD  TRESIBA FLEXTOUCH 100 UNIT/ML SOPN FlexTouch Pen INJECT 20 UNITS INTO THE SKIN DAILY. 04/12/19   Kerin Perna, NP  valACYclovir (VALTREX) 500 MG tablet Take 1 tablet (500 mg total) by mouth daily. 07/19/19   Fulp, Cammie, MD    Allergies Metformin and related  Family History  Problem Relation Age of Onset  . Diabetes Mother   . Heart attack Mother   . Alcohol abuse Father   . Diabetes Sister   . Alcohol abuse Brother     Social History Social History   Tobacco Use  . Smoking status: Current Every Day Smoker    Packs/day: 0.25    Types: Cigarettes  . Smokeless tobacco: Never Used  . Tobacco comment: 4-5 cigarettes a day    Substance Use Topics  . Alcohol use: Never  . Drug use: Never    Review of Systems  Constitutional: No fever/chills Eyes: No visual changes. ENT: No sore throat. Cardiovascular: Denies chest pain. Respiratory: Denies shortness of breath. Gastrointestinal: No abdominal pain.  No nausea, no vomiting.  No diarrhea.  No constipation. Genitourinary: Negative for dysuria. Musculoskeletal: Negative for back pain. Skin: Negative for rash. Neurological: Negative for headaches, focal weakness or numbness. Endocrine:  Diabetes Allergic/Immunilogical: Metformin ____________________________________________   PHYSICAL EXAM:  VITAL SIGNS: ED Triage Vitals  Enc Vitals Group     BP 11/27/19 1117 (!) 147/79     Pulse Rate 11/27/19 1117 95  Resp 11/27/19 1117 18     Temp 11/27/19 1117 99 F (37.2 C)     Temp Source 11/27/19 1117 Oral     SpO2 11/27/19 1117 96 %     Weight 11/27/19 1118 137 lb (62.1 kg)     Height 11/27/19 1118 '5\' 2"'$  (1.575 m)     Head Circumference --      Peak Flow --      Pain Score 11/27/19 1118 9     Pain Loc --      Pain Edu? --      Excl. in [place]? --     Constitutional: Alert and oriented. Well appearing and in no acute distress. Eyes: See visual acuity and nurse's notes.  Conjunctivae are normal. PERRL. EOMI. fluorescein stain reveals large corneal abrasion inferior aspect of eye. Head: Atraumatic. Nose: No congestion/rhinnorhea. Mouth/Throat: Mucous membranes are moist.  Oropharynx non-erythematous. Cardiovascular: Normal rate, regular rhythm. Grossly normal heart sounds.  Good peripheral circulation. Respiratory: Normal respiratory effort.  No retractions. Lungs CTAB. Skin:  Skin is warm, dry and intact. No rash noted. Psychiatric: Mood and affect are normal. Speech and behavior are normal.  ____________________________________________   LABS (all labs ordered are listed, but only abnormal results are displayed)  Labs Reviewed - No data to  display ____________________________________________  EKG   ____________________________________________  RADIOLOGY  ED MD interpretation:    Official radiology report(s): No results found.  ____________________________________________   PROCEDURES  Procedure(s) performed (including Critical Care):  Procedures   ____________________________________________   INITIAL IMPRESSION / ASSESSMENT AND PLAN / ED COURSE  As part of my medical decision making, I reviewed the following data within the [facility]     Patient presents with left eye pain secondary to injury by a knife while removing hair from the rollers of a vacuum cleaner.  Differential consist of puncture, foreign body, corneal abrasion.  Patient given discharge care instruction and advised to follow-up with ophthalmology by calling the clinic Monday morning for an appointment.  Take medication as directed.  Return to ED if condition worsen before appointment.    Camaryn Lumbert was evaluated in Emergency Department on 11/27/2019 for the symptoms described in the history of present illness. She was evaluated in the context of the global COVID-19 pandemic, which necessitated consideration that the patient might be at risk for infection with the SARS-CoV-2 virus that causes COVID-19. Institutional protocols and algorithms that pertain to the evaluation of patients at risk for COVID-19 are in a state of rapid change based on information released by regulatory bodies including the CDC and federal and state organizations. These policies and algorithms were followed during the patient's care in the ED.       ____________________________________________   FINAL CLINICAL IMPRESSION(S) / ED DIAGNOSES  Final diagnoses:  Abrasion of left cornea, initial encounter     ED Discharge Orders         Ordered    erythromycin ophthalmic ointment  4 times daily     11/27/19 1225    hydrOXYzine (ATARAX/VISTARIL)  50 MG tablet  3 times daily PRN     11/27/19 1225    oxyCODONE-acetaminophen (PERCOCET) 5-325 MG tablet  Every 4 hours PRN     11/27/19 1225           Note:  This document was prepared using Dragon voice recognition software and may include unintentional dictation errors.    Sable Feil, PA-C 11/27/19 1230    Marjean Donna  E, MD 11/28/19 [phone]

## 2019-11-27 NOTE — Discharge Instructions (Addendum)
Follow discharge care instruction take medication as directed.  Call the ophthalmology clinic at 830 Monday morning and tell them you follow-up in emergency room.  They will schedule you to come in for reevaluation.

## 2019-11-27 NOTE — ED Triage Notes (Addendum)
Pt arrives to ED c/o L eye pain. States was cleaning a vacuum cleaner and was using a knife to cut the hair off the roller at bottom and knife hit pt L eye. States when "it gets really watery it gets kinda blurred" no bleeding noted. A&O, ambulatory.

## 2019-11-27 NOTE — ED Notes (Signed)
Eye solution and equipment to bedside. Provider Katrinka Blazing verbal hold on visual acuity until pt's eye cleansed as pt in a lot of pain. White of pt's eye red and irritated. Upper and lower eyelids puffy pink/red. Pt's tears tinged pink.

## 2019-11-29 ENCOUNTER — Telehealth: Payer: Self-pay | Admitting: Pharmacist

## 2019-11-29 ENCOUNTER — Telehealth: Payer: Self-pay | Admitting: Acute Care

## 2019-11-29 NOTE — Telephone Encounter (Signed)
Called patient on 11/29/2019   Patient recently injured her eye this past weekend and is in a lot of pain, which is why she was unable to attend appt today. Will mail patient smoking cessation education handout (patient confirms address). Rescheduled appt for 12/06/19 at 1:30 PM via telephone call. Patient verbalized understanding and expressed appreciation.  Thank you for involving pharmacy to assist in providing this patient's care.   Zachery Conch, PharmD PGY2 Ambulatory Care Pharmacy Resident

## 2019-11-29 NOTE — Telephone Encounter (Signed)
Error

## 2019-11-30 NOTE — Progress Notes (Deleted)
Subjective Patient was referred and last seen by Dr. Valeta Harms on 11/18/2019 for smoking cessation counseling. PMH is significant for COPD, asthma, allergic rhinitis, and depression.  Frederik Schmidt, CPhT, ran investigations benefits for smoking cessation agents based on patient's insurance ([employer] Medicare and Medicaid): 1. Chantix starting pack- $4.00 copay  2. Bupropion XL 150 mg daily- $1.30 copay  3. NRT patch, lozenge, gum- [employer] does not pay, but I bypassed to Medicaid and it paid for all 3- $3.00 copay- [employer] brand   Patient contacted via telephone call for virtual smoking cessation appt. -Did she receive smoking cessation handout in the mail?   Social History       Tobacco Use  Smoking Status Current Every Day Smoker  . Packs/day: 0.25  . Types: Cigarettes  Smokeless Tobacco Never Used  Tobacco Comment   4-5 cigarettes a day     Tobacco Use History  Age when started using tobacco on a daily basis ***.  Type: {Nicotine:3044014::"cigarettes","cigar","pipe","electronic cigarettes","snus"}.  Number of cigarettes per day ***, brand ***.  Smokes first cigarette *** minutes after waking.  {Does/does not:3044014::"Does","Does not"} wake at night to smoke  Triggers include {hx nicotine triggers:311123}.  Quit Attempt History   Most recent quit attempt ***.  Longest time ever been tobacco free ***.  Methods tried in the past include {CHL AMB PCMH MEDICATIONS FOR SMOKING CESSATION:20759}.   Rates IMPORTANCE of quitting tobacco on 1-10 scale of ***.  Rates READINESS of quitting tobacco on 1-10 scale of ***.  Rates CONFIDENCE of quitting tobacco on 1-10 scale of ***.  Motivators to quitting include ***; barriers include {smoking cessation barriers:18118}        Immunization History  Administered Date(s) Administered  . Influenza Whole 09/01/2008, 11/09/2009  . Influenza,inj,Quad PF,6+ Mos 06/17/2019  . Influenza-Unspecified 08/03/2018  . Pneumococcal  Polysaccharide-23 11/09/2009  . Td 05/16/2018     Assessment and Plan  1. Smoking Cessation  a. Provided information on 1 800-QUIT NOW support program.  b. Non-Pharmacologic c. Pharmacologic i. Initiated nicotine replacement tx with ***. Patient counseled on purpose, proper use, and potential adverse effects.  1. Nicotine Patch a. Patient counseled on purpose, proper use, and potential adverse effects, includingmild itching or redness at the point of application, headache, trouble sleeping, and/or vivid dreams  b. Patch Schedule for >10 cigarettes daily i. Weeks 1-6: one 21 mg patch daily ii. Weeks 7-8: one 14 mg patch daily iii. Weeks 9-10: one 7 mg patch daily c. Patch Schedule for <10 cigarettes daily i. Weeks 1 to 6:one nicotine patch (14 mg) daily.I will call and re-assess how you are doing at the end of 6 weeks to see how you are doing on 14 mg patch and if you are ready to decrease dose of patch. ii. Weeks 7-8: one nicotine patch (7 mg) daily 2. Nicotine Lozenge a. Patient counseled on purpose, proper use, and potential adverse effects includingnausea, hiccups, cough, and heartburn.  Instructed patient to use  2 mg unless the smoke within 30 minutes of waking up in which they should use 4 mg. b. Lozenge dosing schedule i. Weeks 1 to 6: 1 lozenge every 1 to 2 hours (maximum: 5 lozenges every 6 hours; 20 lozenges/day); to increase chances of quitting, use at least 9 lozenges/day during the first 6 weeks ii. Weeks 7 to 9: 1 lozenge every 2 to 4 hours (maximum: 5 lozenges every 6 hours; 20 lozenges/day) iii. Weeks 10 to 12: 1 lozenge every 4 to 8 hours (maximum: 5  lozenges every 6 hours; 20 lozenges/day) 3. Nicotine Gum a. Patient counseled on purpose, proper use, and potential adverse effects includingjaw soreness and upset stomach if swallowing saliva.  Instructed patient to use 4 mg if they smoke a pack a day or more and 2 mg if they smoke less than a pack a day. b. Gum  dosing schedule i. Weeks 1 to 6: Chew 1 piece of gum every 1 to 2 hours (maximum: 24 pieces/day); to increase chances of quitting, chew at least 9 pieces/day during the first 6 weeks ii. Weeks 7 to 9: Chew 1 piece of gum every 2 to 4 hours (maximum: 24 pieces/day) iii. Weeks 10 to 12: Chew 1 piece of gum every 4 to 8 hours (maximum: 24 pieces/day) 4. Bupropion a. Initiated bupropion ***. Patient with no PMH of seizures. Patient counseled on purpose, proper use, and potential adverse effects, including insomnia, and potential change in mood.  5. Varenicline a. Initiated varenicline titration of 0.5 mg by mouth once daily with food x3 days, then 0.5 mg by mouth twice daily with food x4 days, then 1 mg by mouth twice daily with food thereafter.  CrCL greater than 30 mL/min. Patient counseled on purpose, proper use, and potential adverse effects, including GI upset, and potential change in mood.  2. Medication Reconciliation a. A drug regimen assessment was performed, including review of allergies, interactions, disease-state management, dosing and immunization history. Medications were reviewed with the patient, including name, instructions, indication, goals of therapy, potential side effects, importance of adherence, and safe use. b. Drug interaction(s):  i. Patient is on multiple CNS depressants, therefore, warned patient about the risk of slowed or difficult breathing and/or sedation. 3. Immunizations a. Patient is indicated for shingles vaccination.   This appointment required*** minutesof patient care (this includes precharting, chart review, review of results, face-to-face care, etc.).  Thank you for involving pharmacy to assist in providing this patient's care.   Zachery Conch, PharmD PGY2 Ambulatory Care Pharmacy Resident

## 2019-12-06 ENCOUNTER — Ambulatory Visit: Payer: Medicare Other | Admitting: Pharmacist

## 2019-12-06 ENCOUNTER — Telehealth: Payer: Self-pay | Admitting: Pharmacist

## 2019-12-06 NOTE — Telephone Encounter (Signed)
Called patient on 12/06/2019 at 2:24 PM and left HIPAA-compliant VM with instructions to call [facility] back to reschedule smoking cessation appt. Will await for patient to contact clinic to reschedule appt for further smoking cessation management.   Thank you for involving pharmacy to assist in providing this patient's care.   Zachery Conch, PharmD PGY2 Ambulatory Care Pharmacy Resident

## 2019-12-09 ENCOUNTER — Other Ambulatory Visit: Payer: Self-pay | Admitting: Family Medicine

## 2019-12-09 ENCOUNTER — Ambulatory Visit: Payer: Medicare Other | Attending: Family Medicine | Admitting: Physical Therapy

## 2019-12-09 DIAGNOSIS — F339 Major depressive disorder, recurrent, unspecified: Secondary | ICD-10-CM

## 2019-12-15 ENCOUNTER — Inpatient Hospital Stay: Admission: RE | Admit: 2019-12-15 | Payer: Medicare Other | Source: Ambulatory Visit

## 2019-12-15 ENCOUNTER — Ambulatory Visit: Payer: Medicare Other | Admitting: Acute Care

## 2019-12-15 DIAGNOSIS — F17210 Nicotine dependence, cigarettes, uncomplicated: Secondary | ICD-10-CM

## 2019-12-15 DIAGNOSIS — Z122 Encounter for screening for malignant neoplasm of respiratory organs: Secondary | ICD-10-CM

## 2019-12-15 NOTE — Progress Notes (Signed)
Shared Decision Making Visit Lung Cancer Screening Program [phone])   Eligibility:  Age 64 y.o.  Pack Years Smoking History Calculation 37 pack year smoking history (# packs/per year x # years smoked)  Recent History of coughing up blood  no  Unexplained weight loss? no ( >Than 15 pounds within the last 6 months )  Prior History Lung / other cancer no (Diagnosis within the last 5 years already requiring surveillance chest CT Scans).  Smoking Status Current Smoker  Former Smokers: Years since quit: NA  Quit Date: NA  Visit Components:  Discussion included one or more decision making aids. yes  Discussion included risk/benefits of screening. yes  Discussion included potential follow up diagnostic testing for abnormal scans. yes  Discussion included meaning and risk of over diagnosis. yes  Discussion included meaning and risk of False Positives. yes  Discussion included meaning of total radiation exposure. yes  Counseling Included:  Importance of adherence to annual lung cancer LDCT screening. yes  Impact of comorbidities on ability to participate in the program. yes  Ability and willingness to under diagnostic treatment. yes  Smoking Cessation Counseling:  Current Smokers:   Discussed importance of smoking cessation. yes  Information about tobacco cessation classes and interventions provided to patient. yes  Patient provided with "ticket" for LDCT Scan. yes  Symptomatic Patient. no  Counseling  Diagnosis Code: Tobacco Use Z72.0  Asymptomatic Patient yes  Counseling (Intermediate counseling: > three minutes counseling) O1308  Former Smokers:   Discussed the importance of maintaining cigarette abstinence. yes  Diagnosis Code: Personal History of Nicotine Dependence. M57.846  Information about tobacco cessation classes and interventions provided to patient. Yes  Patient provided with "ticket" for LDCT Scan. yes  Written Order for Lung Cancer  Screening with LDCT placed in Epic. Yes (CT Chest Lung Cancer Screening Low Dose W/O CM) [id number] Z12.2-Screening of respiratory organs Z87.891-Personal history of nicotine dependence  I have spent 25 minutes of face to face time with Ms. Altice discussing the risks and benefits of lung cancer screening. We viewed a power point together that explained in detail the above noted topics. We paused at intervals to allow for questions to be asked and answered to ensure understanding.We discussed that the single most powerful action that she can take to decrease her risk of developing lung cancer is to quit smoking. We discussed whether or not she is ready to commit to setting a quit date. We discussed options for tools to aid in quitting smoking including nicotine replacement therapy, non-nicotine medications, support groups, Quit Smart classes, and behavior modification. We discussed that often times setting smaller, more achievable goals, such as eliminating 1 cigarette a day for a week and then 2 cigarettes a day for a week can be helpful in slowly decreasing the number of cigarettes smoked. This allows for a sense of accomplishment as well as providing a clinical benefit. I gave her the " Be Stronger Than Your Excuses" card with contact information for community resources, classes, free nicotine replacement therapy, and access to mobile apps, text messaging, and on-line smoking cessation help. I have also given her my card and contact information in the event she needs to contact me. We discussed the time and location of the scan, and that either Abigail Miyamoto RN or I will call with the results within 24-48 hours of receiving them. I have offered her  a copy of the power point we viewed  as a resource in the event they need reinforcement of  the concepts we discussed today in the office. The patient verbalized understanding of all of  the above and had no further questions upon leaving the office. They have my  contact information in the event they have any further questions.  I spent 4 minutes counseling on smoking cessation and the health risks of continued tobacco abuse.  I explained to the patient that there has been a high incidence of coronary artery disease noted on these exams. I explained that this is a non-gated exam therefore degree or severity cannot be determined. This patient is on statin therapy. I have asked the patient to follow-up with their PCP regarding any incidental finding of coronary artery disease and management with diet or medication as their PCP  feels is clinically indicated. The patient verbalized understanding of the above and had no further questions upon completion of the visit.      Magdalen Spatz, NP 12/15/2019

## 2019-12-17 ENCOUNTER — Other Ambulatory Visit: Payer: Self-pay | Admitting: Primary Care

## 2020-01-08 ENCOUNTER — Other Ambulatory Visit: Payer: Self-pay

## 2020-01-08 ENCOUNTER — Encounter (HOSPITAL_COMMUNITY): Payer: Self-pay | Admitting: Emergency Medicine

## 2020-01-08 ENCOUNTER — Emergency Department (HOSPITAL_COMMUNITY)
Admission: EM | Admit: 2020-01-08 | Discharge: 2020-01-08 | Disposition: A | Discharge status: Home / Self Care | Payer: Medicare Other | Attending: Emergency Medicine | Admitting: Emergency Medicine

## 2020-01-08 DIAGNOSIS — Z5321 Procedure and treatment not carried out due to patient leaving prior to being seen by health care provider: Secondary | ICD-10-CM | POA: Diagnosis not present

## 2020-01-08 DIAGNOSIS — R1031 Right lower quadrant pain: Secondary | ICD-10-CM | POA: Diagnosis not present

## 2020-01-08 DIAGNOSIS — R1011 Right upper quadrant pain: Secondary | ICD-10-CM | POA: Diagnosis not present

## 2020-01-08 LAB — COMPREHENSIVE METABOLIC PANEL
ALT: 13 U/L (ref 0–44)
AST: 13 U/L — ABNORMAL LOW (ref 15–41)
Albumin: 3.5 g/dL (ref 3.5–5.0)
Alkaline Phosphatase: 57 U/L (ref 38–126)
Anion gap: 12 (ref 5–15)
BUN: 9 mg/dL (ref 8–23)
CO2: 25 mmol/L (ref 22–32)
Calcium: 9.3 mg/dL (ref 8.9–10.3)
Chloride: 102 mmol/L (ref 98–111)
Creatinine, Ser: 0.97 mg/dL (ref 0.44–1.00)
GFR calc Af Amer: >60 mL/min (ref >60)
GFR calc non Af Amer: >60 mL/min (ref >60)
Glucose, Bld: 133 mg/dL — ABNORMAL HIGH (ref 70–99)
Potassium: 3.6 mmol/L (ref 3.5–5.1)
Sodium: 139 mmol/L (ref 135–145)
Total Bilirubin: 0.8 mg/dL (ref 0.3–1.2)
Total Protein: 6.4 g/dL — ABNORMAL LOW (ref 6.5–8.1)

## 2020-01-08 LAB — CBC
HCT: 43.4 % (ref 36.0–46.0)
Hemoglobin: 14.2 g/dL (ref 12.0–15.0)
MCH: 31.6 pg (ref 26.0–34.0)
MCHC: 32.7 g/dL (ref 30.0–36.0)
MCV: 96.4 fL (ref 80.0–100.0)
Platelets: 263 10*3/uL (ref 150–400)
RBC: 4.5 MIL/uL (ref 3.87–5.11)
RDW: 13.4 % (ref 11.5–15.5)
WBC: 11.1 10*3/uL — ABNORMAL HIGH (ref 4.0–10.5)
nRBC: 0 % (ref 0.0–0.2)

## 2020-01-08 LAB — LIPASE, BLOOD: Lipase: 23 U/L (ref 11–51)

## 2020-01-08 MED ORDER — SODIUM CHLORIDE 0.9% FLUSH: 3.0000 mL | Freq: Once | INTRAVENOUS | Status: DC

## 2020-01-08 NOTE — ED Triage Notes (Signed)
C/o R lateral side pain x 4 days.  Denies nausea, vomiting, diarrhea, and urinary complaints.

## 2020-01-08 NOTE — ED Notes (Signed)
Did not respond to vital reassessment

## 2020-01-18 ENCOUNTER — Other Ambulatory Visit: Payer: Self-pay

## 2020-01-18 ENCOUNTER — Emergency Department: Payer: Medicare Other

## 2020-01-18 ENCOUNTER — Emergency Department
Admission: EM | Admit: 2020-01-18 | Discharge: 2020-01-18 | Disposition: A | Discharge status: Home / Self Care | Payer: Medicare Other | Attending: Emergency Medicine | Admitting: Emergency Medicine

## 2020-01-18 ENCOUNTER — Encounter: Payer: Self-pay | Admitting: *Deleted

## 2020-01-18 DIAGNOSIS — J449 Chronic obstructive pulmonary disease, unspecified: Secondary | ICD-10-CM | POA: Diagnosis not present

## 2020-01-18 DIAGNOSIS — J45909 Unspecified asthma, uncomplicated: Secondary | ICD-10-CM | POA: Insufficient documentation

## 2020-01-18 DIAGNOSIS — R0789 Other chest pain: Secondary | ICD-10-CM | POA: Diagnosis present

## 2020-01-18 DIAGNOSIS — E119 Type 2 diabetes mellitus without complications: Secondary | ICD-10-CM | POA: Insufficient documentation

## 2020-01-18 DIAGNOSIS — Z87891 Personal history of nicotine dependence: Secondary | ICD-10-CM | POA: Insufficient documentation

## 2020-01-18 DIAGNOSIS — M940 Chondrocostal junction syndrome [Tietze]: Secondary | ICD-10-CM | POA: Insufficient documentation

## 2020-01-18 DIAGNOSIS — Z79899 Other long term (current) drug therapy: Secondary | ICD-10-CM | POA: Diagnosis not present

## 2020-01-18 LAB — CBC WITH DIFFERENTIAL/PLATELET
Abs Immature Granulocytes: 0.04 10*3/uL (ref 0.00–0.07)
Basophils Absolute: 0.1 10*3/uL (ref 0.0–0.1)
Basophils Relative: 1 %
Eosinophils Absolute: 0.2 10*3/uL (ref 0.0–0.5)
Eosinophils Relative: 1 %
HCT: 44.7 % (ref 36.0–46.0)
Hemoglobin: 15.1 g/dL — ABNORMAL HIGH (ref 12.0–15.0)
Immature Granulocytes: 0 %
Lymphocytes Relative: 19 %
Lymphs Abs: 2.2 10*3/uL (ref 0.7–4.0)
MCH: 31.6 pg (ref 26.0–34.0)
MCHC: 33.8 g/dL (ref 30.0–36.0)
MCV: 93.5 fL (ref 80.0–100.0)
Monocytes Absolute: 0.6 10*3/uL (ref 0.1–1.0)
Monocytes Relative: 5 %
Neutro Abs: 8.5 10*3/uL — ABNORMAL HIGH (ref 1.7–7.7)
Neutrophils Relative %: 74 %
Platelets: 267 10*3/uL (ref 150–400)
RBC: 4.78 MIL/uL (ref 3.87–5.11)
RDW: 13.4 % (ref 11.5–15.5)
WBC: 11.5 10*3/uL — ABNORMAL HIGH (ref 4.0–10.5)
nRBC: 0 % (ref 0.0–0.2)

## 2020-01-18 LAB — URINALYSIS, COMPLETE (UACMP) WITH MICROSCOPIC
Bacteria, UA: NONE SEEN
Bilirubin Urine: NEGATIVE
Glucose, UA: NEGATIVE mg/dL
Hgb urine dipstick: NEGATIVE
Ketones, ur: 5 mg/dL — AB
Leukocytes,Ua: NEGATIVE
Nitrite: NEGATIVE
Protein, ur: NEGATIVE mg/dL
Specific Gravity, Urine: 1.028 (ref 1.005–1.030)
pH: 5 (ref 5.0–8.0)

## 2020-01-18 LAB — COMPREHENSIVE METABOLIC PANEL
ALT: 16 U/L (ref 0–44)
AST: 11 U/L — ABNORMAL LOW (ref 15–41)
Albumin: 4.2 g/dL (ref 3.5–5.0)
Alkaline Phosphatase: 59 U/L (ref 38–126)
Anion gap: 6 (ref 5–15)
BUN: 13 mg/dL (ref 8–23)
CO2: 27 mmol/L (ref 22–32)
Calcium: 9.2 mg/dL (ref 8.9–10.3)
Chloride: 104 mmol/L (ref 98–111)
Creatinine, Ser: 0.84 mg/dL (ref 0.44–1.00)
GFR calc Af Amer: >60 mL/min (ref >60)
GFR calc non Af Amer: >60 mL/min (ref >60)
Glucose, Bld: 222 mg/dL — ABNORMAL HIGH (ref 70–99)
Potassium: 3.6 mmol/L (ref 3.5–5.1)
Sodium: 137 mmol/L (ref 135–145)
Total Bilirubin: 0.6 mg/dL (ref 0.3–1.2)
Total Protein: 7.2 g/dL (ref 6.5–8.1)

## 2020-01-18 LAB — TROPONIN I (HIGH SENSITIVITY): Troponin I (High Sensitivity): 3 ng/L (ref <18)

## 2020-01-18 MED ORDER — PREDNISONE 10 MG PO TABS: 10.0000 mg | ORAL_TABLET | Freq: Every day | ORAL | 0 refills | Status: DC

## 2020-01-18 MED ORDER — PREDNISONE 20 MG PO TABS
60.0000 mg | ORAL_TABLET | Freq: Once | ORAL | Status: AC
  Administered 2020-01-18: 19:00:00 60 mg via ORAL
  Filled 2020-01-18: qty 3

## 2020-01-18 MED ORDER — OXYCODONE-ACETAMINOPHEN 5-325 MG PO TABS
1.0000 | ORAL_TABLET | Freq: Once | ORAL | Status: AC
  Administered 2020-01-18: 1 via ORAL
  Filled 2020-01-18: qty 1

## 2020-01-18 MED ORDER — TRAMADOL HCL 50 MG PO TABS: 50.0000 mg | ORAL_TABLET | Freq: Four times a day (QID) | ORAL | 0 refills | Status: DC | PRN

## 2020-01-18 NOTE — ED Notes (Signed)
Pt prompted again to provide a urine sample. Continues to deny. Given cup of water with verbal okay from [place] .

## 2020-01-18 NOTE — ED Provider Notes (Signed)
[facility] Emergency Department Provider Note  ____________________________________________  Time seen: Approximately 3:30 PM  I have reviewed the triage vital signs and the nursing notes.   HISTORY  Chief Complaint rib pain    HPI Alisha Roth is a 64 y.o. female  Who presents the emergency department complaining of right-sided chest/rib/back pain.  Patient states roughly 4 days worsening pain in the right chest and posterior lateral rib cage.  No reported trauma.  Patient denies any fevers or chills, nasal congestion, sore throat.  She has a chronic cough associated with her COPD but denies any increase of her cough.  No shortness of breath.  Cough is nonproductive.  Patient denies any abdominal pain.  No dysuria, polyuria, hematuria.  No medications for his complaint prior to arrival.        Past Medical History:  Diagnosis Date  . Asthma   . COPD (chronic obstructive pulmonary disease) ([place])   . DM (diabetes mellitus) ([place])   . Former smoker Quit 2016  . Obesity   . Seasonal allergies     Patient Active Problem List   Diagnosis Date Noted  . Medication management 10/31/2019  . Stage 2 moderate COPD by GOLD classification ([place]) 10/26/2018  . SHOULDER PAIN, LEFT 07/13/2009  . TOBACCO ABUSE 12/26/2008  . SCABIES 09/01/2008  . 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 HEADACHE 10/20/2007  . DEPRESSION 05/25/2007  . ASTHMA 05/25/2007  . MENOPAUSAL SYNDROME 05/25/2007  . PLANTAR FASCITIS 12/04/2005  . Allergic rhinitis 11/20/2005    Past Surgical History:  Procedure Laterality Date  . ABDOMINAL HYSTERECTOMY    . CESAREAN SECTION    . LAPAROSCOPIC CHOLECYSTECTOMY      Prior to Admission medications   Medication Sig Start Date End Date Taking? Authorizing Provider  Accu-Chek FastClix Lancets MISC Use to check blood sugars up to 4 times per day 07/19/19   Fulp, Cammie, MD  ACCU-CHEK GUIDE test strip USE AS DIRECTED UP TO 4 TIMES DAILY. DX:  E11.9, Z79.4 07/19/19   Fulp, Cammie, MD  albuterol (PROVENTIL HFA) 108 (90 Base) MCG/ACT inhaler Inhale 2 puffs into the lungs every 6 (six) hours as needed for wheezing or shortness of breath. 07/20/19   Chesley Mires, MD  albuterol (PROVENTIL) (2.5 MG/3ML) 0.083% nebulizer solution Take 3 mLs (2.5 mg total) by nebulization every 6 (six) hours as needed for wheezing or shortness of breath. 10/26/18   [name], NP  B-D UF III MINI PEN NEEDLES 31G X 5 MM MISC Use 1 pen to inject insulin as prescribed daily.  E11.9 Type 2 diabetes 11/23/18   [name], FNP  baclofen (LIORESAL) 10 MG tablet Take 0.5-1 tablets (5-10 mg total) by mouth 3 (three) times daily as needed for muscle spasms. 11/24/19   Hilts, Legrand Como, MD  Blood Glucose Monitoring Suppl (ACCU-CHEK GUIDE) w/Device KIT 1 kit by Does not apply route 4 (four) times daily as needed. To monitor blood sugars 07/19/19   Fulp, Cammie, MD  budesonide-formoterol (SYMBICORT) 160-4.5 MCG/ACT inhaler Inhale 2 puffs into the lungs 2 (two) times daily. 11/18/19   Icard, Octavio Graves, DO  busPIRone (BUSPAR) 10 MG tablet Take 10 mg by mouth 2 (two) times daily as needed.    [provider]  cetirizine (ZYRTEC) 10 MG tablet Take 1 tablet (10 mg total) by mouth daily. 11/23/18   [name], FNP  Cholecalciferol (VITAMIN D-3) 125 MCG (5000 UT) TABS Take 1 tablet by mouth daily. 11/24/19   Hilts, Legrand Como,  MD  diclofenac sodium (VOLTAREN) 1 % GEL Apply 4 g topically 4 (four) times daily. 10/22/18   [name], FNP  DULoxetine (CYMBALTA) 60 MG capsule Take 1 capsule by mouth 2 (two) times daily. 09/28/18   [provider]  erythromycin ophthalmic ointment Place 1 application into the left eye 4 (four) times daily. 11/27/19   Sable Feil, PA-C  hydrOXYzine (ATARAX/VISTARIL) 50 MG tablet Take 1 tablet (50 mg total) by mouth 3 (three) times daily as needed for itching. 11/27/19   Sable Feil, PA-C  liraglutide (VICTOZA) 18 MG/3ML  SOPN Inject 0.3 mLs (1.8 mg total) into the skin daily. Takes in daily morning 11/23/18   [name], FNP  lisinopril (ZESTRIL) 10 MG tablet Take 1 tablet (10 mg total) by mouth daily. Take daily 11/05/19   Fulp, Cammie, MD  LORazepam (ATIVAN) 0.5 MG tablet Take 0.5 mg by mouth every 8 (eight) hours as needed for anxiety. PCP will not provide refills.    [provider]  Magnesium 400 MG CAPS Take 400 mg by mouth daily. 11/24/19   Hilts, Legrand Como, MD  Menatetrenone (VITAMIN K2) 100 MCG TABS Take 100 mcg by mouth daily. 11/24/19   Hilts, Legrand Como, MD  montelukast (SINGULAIR) 10 MG tablet Take 1 tablet (10 mg total) by mouth at bedtime. 11/18/19   Icard, Octavio Graves, DO  omeprazole (PRILOSEC) 40 MG capsule Take 1 capsule (40 mg total) by mouth daily. 11/18/19   Icard, Octavio Graves, DO  oxyCODONE-acetaminophen (PERCOCET) 5-325 MG tablet Take 1 tablet by mouth every 4 (four) hours as needed for severe pain. 11/27/19 11/26/20  Sable Feil, PA-C  predniSONE (DELTASONE) 10 MG tablet Take 1 tablet (10 mg total) by mouth daily. 01/18/20   Gurpreet Mariani, Charline Bills, PA-C  QUEtiapine (SEROQUEL) 25 MG tablet Take 1 tablet (25 mg total) by mouth at bedtime. 11/05/19   Fulp, Cammie, MD  rosuvastatin (CRESTOR) 10 MG tablet Take 1 tablet (10 mg total) by mouth daily. To lower cholesterol 11/22/19   Fulp, Cammie, MD  Tiotropium Bromide Monohydrate (SPIRIVA RESPIMAT) 2.5 MCG/ACT AERS Inhale 2 puffs into the lungs daily. 11/18/19   Icard, Octavio Graves, DO  traMADol (ULTRAM) 50 MG tablet Take 1-2 tablets (50-100 mg total) by mouth at bedtime as needed. 11/24/19   Hilts, Legrand Como, MD  traMADol (ULTRAM) 50 MG tablet Take 1 tablet (50 mg total) by mouth every 6 (six) hours as needed. 01/18/20   Alfard Cochrane, Roderic Palau D, PA-C  TRESIBA FLEXTOUCH 100 UNIT/ML SOPN FlexTouch Pen INJECT 20 UNITS INTO THE SKIN DAILY. 04/12/19   Kerin Perna, NP  valACYclovir (VALTREX) 500 MG tablet Take 1 tablet (500 mg total) by mouth daily. 07/19/19   Fulp,  Cammie, MD    Allergies Metformin and related  Family History  Problem Relation Age of Onset  . Diabetes Mother   . Heart attack Mother   . Alcohol abuse Father   . Diabetes Sister   . Alcohol abuse Brother     Social History Social History   Tobacco Use  . Smoking status: Current Every Day Smoker    Packs/day: 0.25    Types: Cigarettes  . Smokeless tobacco: Never Used  . Tobacco comment: 4-5 cigarettes a day  Substance Use Topics  . Alcohol use: Never  . Drug use: Never     Review of Systems  Constitutional: No fever/chills Eyes: No visual changes. No discharge ENT: No upper respiratory complaints. Cardiovascular: no substernal chest pain.  Positive  for right-sided rib/chest/back pain Respiratory: no cough. No SOB. Gastrointestinal: No abdominal pain.  No nausea, no vomiting.  No diarrhea.  No constipation. Genitourinary: Negative for dysuria. No hematuria Musculoskeletal: Negative for musculoskeletal pain. Skin: Negative for rash, abrasions, lacerations, ecchymosis. Neurological: Negative for headaches, focal weakness or numbness. 10-point ROS otherwise negative.  ____________________________________________   PHYSICAL EXAM:  VITAL SIGNS: ED Triage Vitals [01/18/20 1513]  Enc Vitals Group     BP (!) 156/73     Pulse Rate (!) 105     Resp 20     Temp 98.4 F (36.9 C)     Temp Source Oral     SpO2 98 %     Weight 125 lb (56.7 kg)     Height '5\' 2"'  (1.575 m)     Head Circumference      Peak Flow      Pain Score 10     Pain Loc      Pain Edu?      Excl. in [place]?      Constitutional: Alert and oriented. Well appearing and in no acute distress. Eyes: Conjunctivae are normal. PERRL. EOMI. Head: Atraumatic. ENT:      Ears:       Nose: No congestion/rhinnorhea.      Mouth/Throat: Mucous membranes are moist.  Neck: No stridor.  No cervical spine tenderness to palpation. Hematological/Lymphatic/Immunilogical: No cervical  lymphadenopathy. Cardiovascular: Normal rate, regular rhythm. Normal S1 and S2.  Good peripheral circulation. Respiratory: Normal respiratory effort without tachypnea or retractions. Lungs CTAB. Good air entry to the bases with no decreased or absent breath sounds. Gastrointestinal: Bowel sounds 4 quadrants. Soft and nontender to palpation. No guarding or rigidity. No palpable masses. No distention. No CVA tenderness. Musculoskeletal: Full range of motion to all extremities. No gross deformities appreciated.  Visualization of the right ribs is feels no overlying skin changes.  No evidence of trauma.  No deformity.  No paradoxical chest wall movement.  Patient is tender to palpation along the intercostal margins between ribs 7 through 10.  No palpable abnormality.  This occurs mostly along the posterior lateral ribs but does extend to the mid axillary line.  Good underlying breath sounds bilaterally. Neurologic:  Normal speech and language. No gross focal neurologic deficits are appreciated.  Skin:  Skin is warm, dry and intact. No rash noted. Psychiatric: Mood and affect are normal. Speech and behavior are normal. Patient exhibits appropriate insight and judgement.   ____________________________________________   LABS (all labs ordered are listed, but only abnormal results are displayed)  Labs Reviewed  COMPREHENSIVE METABOLIC PANEL - Abnormal; Notable for the following components:      Result Value   Glucose, Bld 222 (*)    AST 11 (*)    All other components within normal limits  CBC WITH DIFFERENTIAL/PLATELET - Abnormal; Notable for the following components:   WBC 11.5 (*)    Hemoglobin 15.1 (*)    Neutro Abs 8.5 (*)    All other components within normal limits  URINALYSIS, COMPLETE (UACMP) WITH MICROSCOPIC - Abnormal; Notable for the following components:   Color, Urine YELLOW (*)    APPearance CLOUDY (*)    Ketones, ur 5 (*)    All other components within normal limits  TROPONIN I  (HIGH SENSITIVITY)   ____________________________________________  EKG   ____________________________________________  RADIOLOGY I personally viewed and evaluated these images as part of my medical decision making, as well as reviewing the written report by the radiologist.  DG Chest 2 View  Result Date: 01/18/2020 CLINICAL DATA:  Cough, right rib pain. EXAM: CHEST - 2 VIEW COMPARISON:  May 20, 2019. FINDINGS: The heart size and mediastinal contours are within normal limits. Both lungs are clear. No pneumothorax or pleural effusion is noted. The visualized skeletal structures are unremarkable. IMPRESSION: No active cardiopulmonary disease. Electronically Signed   By: Marijo Conception M.D.   On: 01/18/2020 16:27    ____________________________________________    PROCEDURES  Procedure(s) performed:    Procedures    Medications  predniSONE (DELTASONE) tablet 60 mg (has no administration in time range)  oxyCODONE-acetaminophen (PERCOCET/ROXICET) 5-325 MG per tablet 1 tablet (1 tablet Oral Given 01/18/20 1607)        ____________________________________________   INITIAL IMPRESSION / ASSESSMENT AND PLAN / ED COURSE  Pertinent labs & imaging results that were available during my care of the patient were reviewed by me and considered in my medical decision making (see chart for details).  Review of the [place] CSRS was performed in accordance of the [place] prior to dispensing any controlled drugs.  Clinical Course as of Jan 17 1841  Tue Jan 18, 2020  1602 Patient presented to emergency department complaining of right sided chest pain/rib pain/back pain.  Overall exam is reassuring and I feel given exam that this is likely costochondritis, however differential includes STEMI/ACS, pneumonia, pneumothorax, PE, pyelonephritis, cholecystitis.  I will evaluate the patient at this time with urinalysis, basic labs, chest x-ray.   [JC]    Clinical Course User Index [JC] Bauer Ausborn,  Charline Bills, PA-C          Patient's diagnosis is consistent with costochondritis.  Patient presented to emergency department complaining of right-sided chest/rib/back pain.  Overall exam revealed reproducible symptoms along the right posterior lateral ribs.  Given the distribution, and exam I felt this was most likely costochondritis but given the patient's complaints I did evaluate the patient with x-ray, labs, urinalysis.  Remainder of work-up is reassuring.  At this time I will treat the patient for costochondritis with Ultram and prednisone.  Follow-up primary care as needed.. Patient is given ED precautions to return to the ED for any worsening or new symptoms.     ____________________________________________  FINAL CLINICAL IMPRESSION(S) / ED DIAGNOSES  Final diagnoses:  Costochondritis      NEW MEDICATIONS STARTED DURING THIS VISIT:  ED Discharge Orders         Ordered    predniSONE (DELTASONE) 10 MG tablet  Daily    Note to Pharmacy: Take 6 pills x 2 days, 5 pills x 2 days, 4 pills x 2 days, 3 pills x 2 days, 2 pills x 2 days, and 1 pill x 2 days   01/18/20 1837    traMADol (ULTRAM) 50 MG tablet  Every 6 hours PRN     01/18/20 1837              This chart was dictated using voice recognition software/Dragon. Despite best efforts to proofread, errors can occur which can change the meaning. Any change was purely unintentional.   Darletta Moll, PA-C 01/18/20 1842    Delman Kitten, MD 01/18/20 [phone]

## 2020-01-18 NOTE — ED Notes (Addendum)
See triage note. Pt alert and sitting calmly in bed. Pt irritable with this RN that blood results will take about to come back. Pt c/o right sided rib pain. Resp reg/unlabored. Denies CP/SOB. Denies specific mechanism of injury. Notified a urine sample is needed.

## 2020-01-18 NOTE — ED Triage Notes (Signed)
Pt has right posterior rib pain with a cough.  No back pain.  No n/v/d. No abd pain.  No chest pain or sob.  No known injury to rib area.  Pt alert  Speech clear.

## 2020-01-18 NOTE — ED Notes (Signed)
Charting error: 2nd trop not collected as Christiane Ha PA verbal that it isn't necessary.

## 2020-02-08 ENCOUNTER — Other Ambulatory Visit: Payer: Self-pay | Admitting: Family Medicine

## 2020-02-08 DIAGNOSIS — Z794 Long term (current) use of insulin: Secondary | ICD-10-CM

## 2020-02-16 ENCOUNTER — Other Ambulatory Visit: Payer: Self-pay | Admitting: Family Medicine

## 2020-02-16 DIAGNOSIS — F339 Major depressive disorder, recurrent, unspecified: Secondary | ICD-10-CM

## 2020-02-16 NOTE — Telephone Encounter (Signed)
QUETIAPINE Pt states 25mg  every day at bedtime works fine but it would be better if increased a little. Pt has new phone number:  [phone].  Pt states only two pills left.

## 2020-03-19 ENCOUNTER — Other Ambulatory Visit: Payer: Self-pay | Admitting: Family Medicine

## 2020-03-19 NOTE — Telephone Encounter (Signed)
Requested Prescriptions  Pending Prescriptions Disp Refills   B-D UF III MINI PEN NEEDLES 31G X 5 MM MISC [Pharmacy Med Name: BD UF MINI PEN NEEDLE 5MMX31G] 200 each 3    Sig: USE 1 PEN TO INJECT INSULIN AS PRESCRIBED DAILY. E11.9 TYPE 2 DIABETES     There is no refill protocol information for this order

## 2020-03-23 ENCOUNTER — Other Ambulatory Visit: Payer: Self-pay | Admitting: Family Medicine

## 2020-03-23 DIAGNOSIS — E785 Hyperlipidemia, unspecified: Secondary | ICD-10-CM

## 2020-04-04 ENCOUNTER — Other Ambulatory Visit: Payer: Self-pay | Admitting: Family Medicine

## 2020-04-04 DIAGNOSIS — F339 Major depressive disorder, recurrent, unspecified: Secondary | ICD-10-CM

## 2020-04-04 NOTE — Telephone Encounter (Signed)
Medication Refill - Medication:QUEtiapine (SEROQUEL) 25 MG tablet   Has the patient contacted their pharmacy? yes (Agent: If no, request that the patient contact the pharmacy for the refill.) (Agent: If yes, when and what did the pharmacy advise?)Contact PCP  Preferred Pharmacy (with phone number or street name):  [id number] [place], [place] - [address] Phone:  [phone]  Fax:  [phone]       Agent: Please be advised that RX refills may take up to 3 business days. We ask that you follow-up with your pharmacy.

## 2020-04-04 NOTE — Telephone Encounter (Signed)
Requested medication (s) are due for refill today: yes  Requested medication (s) are on the active medication list: yes  Last refill:  02/15/30 #30  Future visit scheduled: no  Notes to clinic:  Please review for refill. Refill not delegated per protocol.     Requested Prescriptions  Pending Prescriptions Disp Refills   QUEtiapine (SEROQUEL) 25 MG tablet 30 tablet 0    Sig: TAKE 1 TABLET BY MOUTH EVERYDAY AT BEDTIME      Not Delegated - Psychiatry:  Antipsychotics - Second Generation (Atypical) - quetiapine Failed - 04/04/2020  2:44 PM      Failed - This refill cannot be delegated      Failed - AST in normal range and within 180 days    AST  Date Value Ref Range Status  01/18/2020 11 (L) 15 - 41 U/L Final          Failed - Completed PHQ-2 or PHQ-9 in the last 360 days.      Failed - Last BP in normal range    BP Readings from Last 1 Encounters:  01/18/20 (!) 142/78          Passed - ALT in normal range and within 180 days    ALT  Date Value Ref Range Status  01/18/2020 16 0 - 44 U/L Final          Passed - Valid encounter within last 6 months    Recent Outpatient Visits           5 months ago Type 2 diabetes mellitus without complication, with long-term current use of insulin (HCC)   [facility] [place], [place], MD

## 2020-04-05 ENCOUNTER — Telehealth: Payer: Self-pay | Admitting: Family Medicine

## 2020-04-05 NOTE — Telephone Encounter (Signed)
Made call to patient / appt given with Gwinda Passe NP for f /u

## 2020-04-05 NOTE — Telephone Encounter (Signed)
° °  Copied from CRM [id number]. Topic: General - Other >> Apr 04, 2020  2:40 PM Dalphine Handing A wrote: Patient is wanting to speak with a nurse about which medications she is due for a refill on. Patient stated that she is unable to keep up with this. Please advise

## 2020-04-06 ENCOUNTER — Telehealth (INDEPENDENT_AMBULATORY_CARE_PROVIDER_SITE_OTHER): Payer: Medicare Other | Admitting: Primary Care

## 2020-04-06 ENCOUNTER — Other Ambulatory Visit: Payer: Self-pay

## 2020-04-06 DIAGNOSIS — F32A Depression, unspecified: Secondary | ICD-10-CM

## 2020-04-06 MED ORDER — DULOXETINE HCL 60 MG PO CPEP: 60.0000 mg | ORAL_CAPSULE | Freq: Two times a day (BID) | ORAL | 0 refills | Status: AC

## 2020-04-06 NOTE — Progress Notes (Signed)
Pt called and was needing refill on Duloxetine 60 mg, Pt stated only had 1 left & was not able to get filled with Primary Dr.. Dr. Donavan Foil agreed to send 30 day Rx. Pt verbalized understanding.

## 2020-04-06 NOTE — Telephone Encounter (Signed)
Called Pt regarding getting a refill on her Medication, no answer,could not leave message due to VM not being set up yet.Will retry later today.

## 2020-04-06 NOTE — Telephone Encounter (Signed)
-----   Message from Darrel Hoover, RN sent at 04/05/2020  1:43 PM EDT ----- Regarding: Medication refill - only 1 left Patient left voicemail message 04/05/20 at 1203 pm.  States she is calling about her medication.  States she only has 1 left and needs a refill.  Would you please reach out to her today in regards to a refill on her medication. Thanks, [employer]

## 2020-04-10 ENCOUNTER — Other Ambulatory Visit: Payer: Self-pay | Admitting: Family Medicine

## 2020-04-10 DIAGNOSIS — F339 Major depressive disorder, recurrent, unspecified: Secondary | ICD-10-CM

## 2020-04-10 NOTE — Telephone Encounter (Signed)
Medication Refill - Medication: QUEtiapine (SEROQUEL) 25 MG tablet Pt is completely out and asked if this can be sent today  Has the patient contacted their pharmacy? No. (Agent: If no, request that the patient contact the pharmacy for the refill.) (Agent: If yes, when and what did the pharmacy advise?)  Preferred Pharmacy (with phone number or street name): [id number] [place], [place] - [address]  [address] [place] [address]  Phone:  [phone] Fax:  [phone]   Agent: Please be advised that RX refills may take up to 3 business days. We ask that you follow-up with your pharmacy.

## 2020-04-10 NOTE — Telephone Encounter (Signed)
Requested medication (s) are due for refill today: yes  Requested medication (s) are on the active medication list: yes  Last refill:  02/16/2020  Future visit scheduled: yes  Notes to clinic:  this refill cannot be delegated    Requested Prescriptions  Pending Prescriptions Disp Refills   QUEtiapine (SEROQUEL) 25 MG tablet 30 tablet 0    Sig: TAKE 1 TABLET BY MOUTH EVERYDAY AT BEDTIME      Not Delegated - Psychiatry:  Antipsychotics - Second Generation (Atypical) - quetiapine Failed - 04/10/2020  2:23 PM      Failed - This refill cannot be delegated      Failed - AST in normal range and within 180 days    AST  Date Value Ref Range Status  01/18/2020 11 (L) 15 - 41 U/L Final          Failed - Completed PHQ-2 or PHQ-9 in the last 360 days.      Failed - Last BP in normal range    BP Readings from Last 1 Encounters:  01/18/20 (!) 142/78          Passed - ALT in normal range and within 180 days    ALT  Date Value Ref Range Status  01/18/2020 16 0 - 44 U/L Final          Passed - Valid encounter within last 6 months    Recent Outpatient Visits           5 months ago Type 2 diabetes mellitus without complication, with long-term current use of insulin (HCC)   [facility] [place], [place], MD

## 2020-04-11 MED ORDER — QUETIAPINE FUMARATE 25 MG PO TABS: ORAL_TABLET | ORAL | 0 refills | Status: DC

## 2020-05-16 ENCOUNTER — Telehealth: Payer: Self-pay | Admitting: Internal Medicine

## 2020-05-16 DIAGNOSIS — F339 Major depressive disorder, recurrent, unspecified: Secondary | ICD-10-CM

## 2020-05-16 NOTE — Telephone Encounter (Signed)
Medication Refill - Medication: QUEtiapine (SEROQUEL) 25 MG tablet    Has the patient contacted their pharmacy? Yes.   (Agent: If no, request that the patient contact the pharmacy for the refill.) (Agent: If yes, when and what did the pharmacy advise?)  Preferred Pharmacy (with phone number or street name):  [id number] [place], [place] - [address]  [address] [place] [address]  Phone: [phone] Fax: [phone]     Agent: Please be advised that RX refills may take up to 3 business days. We ask that you follow-up with your pharmacy.

## 2020-05-16 NOTE — Telephone Encounter (Signed)
Requested medication (s) are due for refill today:  Yes  Requested medication (s) are on the active medication list:  Yes  Future visit scheduled:  Yes  Last Refill: 7/27/; #30; RF 0  Notes to clinic: medication is not delegated  Requested Prescriptions  Pending Prescriptions Disp Refills   QUEtiapine (SEROQUEL) 25 MG tablet 30 tablet 0    Sig: TAKE 1 TABLET BY MOUTH EVERYDAY AT BEDTIME      Not Delegated - Psychiatry:  Antipsychotics - Second Generation (Atypical) - quetiapine Failed - 05/16/2020 12:10 PM      Failed - This refill cannot be delegated      Failed - AST in normal range and within 180 days    AST  Date Value Ref Range Status  01/18/2020 11 (L) 15 - 41 U/L Final          Failed - Completed PHQ-2 or PHQ-9 in the last 360 days.      Failed - Last BP in normal range    BP Readings from Last 1 Encounters:  01/18/20 (!) 142/78          Failed - Valid encounter within last 6 months    Recent Outpatient Visits           6 months ago Type 2 diabetes mellitus without complication, with long-term current use of insulin (HCC)   [facility] [place], [place], MD       Future Appointments             In 1 month [place], Marzella Schlein, [place]  [facility]            Passed - ALT in normal range and within 180 days    ALT  Date Value Ref Range Status  01/18/2020 16 0 - 44 U/L Final

## 2020-05-17 NOTE — Telephone Encounter (Signed)
Pt is calling and is out of seroquel since yesterday. Please advise

## 2020-05-18 MED ORDER — QUETIAPINE FUMARATE 25 MG PO TABS: ORAL_TABLET | ORAL | 0 refills | Status: DC

## 2020-05-18 NOTE — Addendum Note (Signed)
Addended by: Lois Huxley, Jeannett Senior L on: 05/18/2020 05:36 PM   Modules accepted: Orders

## 2020-05-18 NOTE — Telephone Encounter (Signed)
Rx sent 

## 2020-05-18 NOTE — Telephone Encounter (Signed)
Pt has an appt on 06-15-2020 and pt is checking on status of seroquel refill

## 2020-05-24 ENCOUNTER — Ambulatory Visit (HOSPITAL_COMMUNITY): Payer: Medicare Other | Admitting: Psychiatry

## 2020-05-24 ENCOUNTER — Encounter: Payer: Medicare Other | Admitting: Acute Care

## 2020-05-24 NOTE — Progress Notes (Deleted)
Shared Decision Making Visit Lung Cancer Screening Program [phone])   Eligibility:  Age 64 y.o.  Pack Years Smoking History Calculation *** (# packs/per year x # years smoked)  Recent History of coughing up blood  {YES NO:22349}  Unexplained weight loss? {YES NO:22349} ( >Than 15 pounds within the last 6 months )  Prior History Lung / other cancer {YES NO:22349} (Diagnosis within the last 5 years already requiring surveillance chest CT Scans).  Smoking Status {Smoking Status:21012044}  Former Smokers: Years since quit: {Smoking numbers:21012046}  Quit Date: ***  Visit Components:  Discussion included one or more decision making aids. {YES NO:22349}  Discussion included risk/benefits of screening. {YES NO:22349}  Discussion included potential follow up diagnostic testing for abnormal scans. {YES NO:22349}  Discussion included meaning and risk of over diagnosis. {YES NO:22349}  Discussion included meaning and risk of False Positives. {YES NO:22349}  Discussion included meaning of total radiation exposure. {YES [id number]  Counseling Included:  Importance of adherence to annual lung cancer LDCT screening. {YES NO:22349}  Impact of comorbidities on ability to participate in the program. {YES NO:22349}  Ability and willingness to under diagnostic treatment. {YES NO:22349}  Smoking Cessation Counseling:  Current Smokers:   Discussed importance of smoking cessation. {YES [id number]  Information about tobacco cessation classes and interventions provided to patient. {YES [id number]  Patient provided with "ticket" for LDCT Scan. {YES NO:22349}  Symptomatic Patient. {YES NO:22349}  Counseling{Symptomatic Patient:21012041}  Diagnosis Code: Tobacco Use Z72.0  Asymptomatic Patient {YES NO:22349}  Counseling {Asymptomatic patient:21012042}  Former Smokers:   Discussed the importance of maintaining cigarette abstinence. {YES NO:22349}  Diagnosis Code: Personal History  of Nicotine Dependence. L89.211  Information about tobacco cessation classes and interventions provided to patient. {Responses; yes/no/refused:32142}  Patient provided with "ticket" for LDCT Scan. {YES [id number]  Written Order for Lung Cancer Screening with LDCT placed in Epic. {Smoking cessesion custom:21012043} (CT Chest Lung Cancer Screening Low Dose W/O CM) [id number] Z12.2-Screening of respiratory organs Z87.891-Personal history of nicotine dependence   Bevelyn Ngo, NP

## 2020-06-10 ENCOUNTER — Other Ambulatory Visit: Payer: Self-pay | Admitting: Family Medicine

## 2020-06-10 DIAGNOSIS — F339 Major depressive disorder, recurrent, unspecified: Secondary | ICD-10-CM

## 2020-06-10 NOTE — Telephone Encounter (Signed)
Requested medications are due for refill today?  Yes - This refill cannot be delegated.    Requested medications are on active medication list?  Yes  Last Refill:   05/18/2020  # 30 with no refills   Future visit scheduled?  Yes in 5 days.    Notes to Clinic:   This Refill cannot be delegated.

## 2020-06-15 ENCOUNTER — Ambulatory Visit: Payer: Medicare Other | Admitting: Physician Assistant

## 2020-06-21 ENCOUNTER — Inpatient Hospital Stay: Admission: RE | Admit: 2020-06-21 | Payer: Medicare Other | Source: Ambulatory Visit

## 2020-06-21 ENCOUNTER — Encounter: Payer: Medicare Other | Admitting: Acute Care

## 2020-07-03 ENCOUNTER — Other Ambulatory Visit: Payer: Self-pay

## 2020-07-03 ENCOUNTER — Ambulatory Visit: Payer: Medicare HMO | Attending: Family Medicine | Admitting: Family Medicine

## 2020-07-03 ENCOUNTER — Encounter: Payer: Self-pay | Admitting: Family Medicine

## 2020-07-03 VITALS — BP 128/78 | HR 80 | Temp 97.3°F | Ht 62.0 in | Wt 148.8 lb

## 2020-07-03 DIAGNOSIS — G8929 Other chronic pain: Secondary | ICD-10-CM

## 2020-07-03 DIAGNOSIS — M545 Low back pain, unspecified: Secondary | ICD-10-CM

## 2020-07-03 DIAGNOSIS — Z794 Long term (current) use of insulin: Secondary | ICD-10-CM | POA: Diagnosis not present

## 2020-07-03 DIAGNOSIS — Z23 Encounter for immunization: Secondary | ICD-10-CM | POA: Diagnosis not present

## 2020-07-03 DIAGNOSIS — E119 Type 2 diabetes mellitus without complications: Secondary | ICD-10-CM

## 2020-07-03 DIAGNOSIS — H9193 Unspecified hearing loss, bilateral: Secondary | ICD-10-CM | POA: Diagnosis not present

## 2020-07-03 DIAGNOSIS — J441 Chronic obstructive pulmonary disease with (acute) exacerbation: Secondary | ICD-10-CM | POA: Diagnosis not present

## 2020-07-03 DIAGNOSIS — M25561 Pain in right knee: Secondary | ICD-10-CM | POA: Diagnosis not present

## 2020-07-03 DIAGNOSIS — M25562 Pain in left knee: Secondary | ICD-10-CM

## 2020-07-03 DIAGNOSIS — E2839 Other primary ovarian failure: Secondary | ICD-10-CM

## 2020-07-03 DIAGNOSIS — Z1231 Encounter for screening mammogram for malignant neoplasm of breast: Secondary | ICD-10-CM

## 2020-07-03 DIAGNOSIS — F339 Major depressive disorder, recurrent, unspecified: Secondary | ICD-10-CM

## 2020-07-03 LAB — POCT GLYCOSYLATED HEMOGLOBIN (HGB A1C): Hemoglobin A1C: 8.3 % — AB (ref 4.0–5.6)

## 2020-07-03 LAB — GLUCOSE, POCT (MANUAL RESULT ENTRY): POC Glucose: 219 mg/dL — AB (ref 70–99)

## 2020-07-03 MED ORDER — QUETIAPINE FUMARATE 25 MG PO TABS: ORAL_TABLET | ORAL | 3 refills | Status: DC

## 2020-07-03 MED ORDER — MELOXICAM 15 MG PO TABS: 15.0000 mg | ORAL_TABLET | Freq: Every day | ORAL | 1 refills | Status: AC

## 2020-07-03 MED ORDER — DOXYCYCLINE HYCLATE 100 MG PO TABS: 100.0000 mg | ORAL_TABLET | Freq: Two times a day (BID) | ORAL | 0 refills | Status: DC

## 2020-07-03 NOTE — Progress Notes (Signed)
Established Patient Office Visit  Subjective:  Patient ID: Alisha Roth, female    DOB: 25-Jan-1956  Age: 64 y.o. MRN: [id number]  CC:  Chief Complaint  Patient presents with  . Knee Pain    HPI Tu Alisha Roth, 64 yo female, seen in follow-up of chronic medical issues including type 2 diabetes, COPD for which patient states she is having a recent increase in chest congestion and productive cough, she also has complaint at today's visit of bilateral knee pain for which she would like a referral to orthopedics for further evaluation and treatment.  She also feels that she is having issues with her hearing as she does not hear well if there is background noise or does not correctly hear what is being said.  Past Medical History:  Diagnosis Date  . Asthma   . COPD (chronic obstructive pulmonary disease) ([place])   . DM (diabetes mellitus) ([place])   . Former smoker Quit 2016  . Obesity   . Seasonal allergies     Past Surgical History:  Procedure Laterality Date  . ABDOMINAL HYSTERECTOMY    . CESAREAN SECTION    . LAPAROSCOPIC CHOLECYSTECTOMY      Family History  Problem Relation Age of Onset  . Diabetes Mother   . Heart attack Mother   . Alcohol abuse Father   . Diabetes Sister   . Alcohol abuse Brother     Social History   Socioeconomic History  . Marital status: Single    Spouse name: Not on file  . Number of children: Not on file  . Years of education: Not on file  . Highest education level: Not on file  Occupational History  . Not on file  Tobacco Use  . Smoking status: Current Every Day Smoker    Packs/day: 0.25    Types: Cigarettes  . Smokeless tobacco: Never Used  . Tobacco comment: 4-5 cigarettes a day  Vaping Use  . Vaping Use: Never used  Substance and Sexual Activity  . Alcohol use: Never  . Drug use: Never  . Sexual activity: Yes  Other Topics Concern  . Not on file  Social History Narrative  . Not on file   Social Determinants of Health    Financial Resource Strain:   . Difficulty of Paying Living Expenses: Not on file  Food Insecurity:   . Worried About Charity fundraiser in the Last Year: Not on file  . Ran Out of Food in the Last Year: Not on file  Transportation Needs:   . Lack of Transportation (Medical): Not on file  . Lack of Transportation (Non-Medical): Not on file  Physical Activity:   . Days of Exercise per Week: Not on file  . Minutes of Exercise per Session: Not on file  Stress:   . Feeling of Stress : Not on file  Social Connections:   . Frequency of Communication with Friends and Family: Not on file  . Frequency of Social Gatherings with Friends and Family: Not on file  . Attends Religious Services: Not on file  . Active Member of Clubs or Organizations: Not on file  . Attends Archivist Meetings: Not on file  . Marital Status: Not on file  Intimate Partner Violence:   . Fear of Current or Ex-Partner: Not on file  . Emotionally Abused: Not on file  . Physically Abused: Not on file  . Sexually Abused: Not on file    Outpatient Medications Prior to Visit  Medication  Sig Dispense Refill  . Accu-Chek FastClix Lancets MISC Use to check blood sugars up to 4 times per day 120 each prn  . ACCU-CHEK GUIDE test strip USE AS DIRECTED UP TO 4 TIMES DAILY. DX: E11.9, Z79.4 200 each 1  . albuterol (PROVENTIL HFA) 108 (90 Base) MCG/ACT inhaler Inhale 2 puffs into the lungs every 6 (six) hours as needed for wheezing or shortness of breath. 6.7 g 5  . albuterol (PROVENTIL) (2.5 MG/3ML) 0.083% nebulizer solution Take 3 mLs (2.5 mg total) by nebulization every 6 (six) hours as needed for wheezing or shortness of breath. 75 mL 6  . B-D UF III MINI PEN NEEDLES 31G X 5 MM MISC USE 1 PEN TO INJECT INSULIN AS PRESCRIBED DAILY. E11.9 TYPE 2 DIABETES 200 each 3  . baclofen (LIORESAL) 10 MG tablet Take 0.5-1 tablets (5-10 mg total) by mouth 3 (three) times daily as needed for muscle spasms. (Patient not taking:  Reported on 07/11/2020) 30 each 3  . Blood Glucose Monitoring Suppl (ACCU-CHEK GUIDE) w/Device KIT 1 kit by Does not apply route 4 (four) times daily as needed. To monitor blood sugars 1 kit 0  . budesonide-formoterol (SYMBICORT) 160-4.5 MCG/ACT inhaler Inhale 2 puffs into the lungs 2 (two) times daily. 10.2 g 0  . busPIRone (BUSPAR) 10 MG tablet Take 10 mg by mouth 2 (two) times daily as needed.    . cetirizine (ZYRTEC) 10 MG tablet Take 1 tablet (10 mg total) by mouth daily. 60 tablet 3  . Cholecalciferol (VITAMIN D-3) 125 MCG (5000 UT) TABS Take 1 tablet by mouth daily. 90 tablet 3  . diclofenac sodium (VOLTAREN) 1 % GEL Apply 4 g topically 4 (four) times daily. 100 g 0  . DULoxetine (CYMBALTA) 60 MG capsule Take 1 capsule (60 mg total) by mouth 2 (two) times daily. 30 capsule 0  . erythromycin ophthalmic ointment Place 1 application into the left eye 4 (four) times daily. 3.5 g 0  . hydrOXYzine (ATARAX/VISTARIL) 50 MG tablet Take 1 tablet (50 mg total) by mouth 3 (three) times daily as needed for itching. 15 tablet 0  . liraglutide (VICTOZA) 18 MG/3ML SOPN Inject 0.3 mLs (1.8 mg total) into the skin daily. Takes in daily morning 3 mL 3  . lisinopril (ZESTRIL) 10 MG tablet TAKE 1 TABLET BY MOUTH EVERY DAY 90 tablet 0  . LORazepam (ATIVAN) 0.5 MG tablet Take 0.5 mg by mouth every 8 (eight) hours as needed for anxiety. PCP will not provide refills.    . Magnesium 400 MG CAPS Take 400 mg by mouth daily. 90 capsule 1  . Menatetrenone (VITAMIN K2) 100 MCG TABS Take 100 mcg by mouth daily. 90 tablet 3  . montelukast (SINGULAIR) 10 MG tablet Take 1 tablet (10 mg total) by mouth at bedtime. 30 tablet 11  . omeprazole (PRILOSEC) 40 MG capsule Take 1 capsule (40 mg total) by mouth daily. 90 capsule 3  . rosuvastatin (CRESTOR) 10 MG tablet TAKE 1 TABLET BY MOUTH DAILY. TO LOWER CHOLESTEROL 90 tablet 2  . Tiotropium Bromide Monohydrate (SPIRIVA RESPIMAT) 2.5 MCG/ACT AERS Inhale 2 puffs into the lungs daily.  4 g 5  . traMADol (ULTRAM) 50 MG tablet Take 1-2 tablets (50-100 mg total) by mouth at bedtime as needed. 20 tablet 0  . traMADol (ULTRAM) 50 MG tablet Take 1 tablet (50 mg total) by mouth every 6 (six) hours as needed. 10 tablet 0  . TRESIBA FLEXTOUCH 100 UNIT/ML SOPN FlexTouch Pen INJECT 20 UNITS  INTO THE SKIN DAILY. 15 mL 3  . valACYclovir (VALTREX) 500 MG tablet Take 1 tablet (500 mg total) by mouth daily. 90 tablet 3  . QUEtiapine (SEROQUEL) 25 MG tablet TAKE 1 TABLET BY MOUTH EVERYDAY AT BEDTIME 30 tablet 0  . oxyCODONE-acetaminophen (PERCOCET) 5-325 MG tablet Take 1 tablet by mouth every 4 (four) hours as needed for severe pain. (Patient not taking: Reported on 07/03/2020) 20 tablet 0  . predniSONE (DELTASONE) 10 MG tablet Take 1 tablet (10 mg total) by mouth daily. (Patient not taking: Reported on 07/03/2020) 42 tablet 0   No facility-administered medications prior to visit.    Allergies  Allergen Reactions  . Metformin And Related Diarrhea    ROS Review of Systems  Constitutional: Positive for fatigue. Negative for chills and fever.  HENT: Negative for sore throat and trouble swallowing.   Respiratory: Positive for cough, chest tightness and shortness of breath.   Cardiovascular: Negative for chest pain and palpitations.  Gastrointestinal: Negative for abdominal pain, constipation, diarrhea and nausea.  Endocrine: Negative for polydipsia, polyphagia and polyuria.  Genitourinary: Negative for dysuria and frequency.  Musculoskeletal: Positive for arthralgias and gait problem.  Neurological: Negative for dizziness and headaches.  Hematological: Negative for adenopathy. Does not bruise/bleed easily.  Psychiatric/Behavioral: Negative for suicidal ideas. The patient is nervous/anxious.       Objective:    Physical Exam Vitals and nursing note reviewed.  Constitutional:      General: She is not in acute distress.    Appearance: Normal appearance.  HENT:     Right Ear: Ear  canal and external ear normal.     Left Ear: Ear canal and external ear normal.     Ears:     Comments: TMs dull bilaterally    Nose: Congestion and rhinorrhea (Mild clear nasal discharge) present.  Neck:     Vascular: No carotid bruit.  Cardiovascular:     Rate and Rhythm: Normal rate and regular rhythm.  Pulmonary:     Effort: Pulmonary effort is normal.     Breath sounds: Rhonchi present.     Comments: No increased work of breathing, scattered coarse breath sounds/rhonchi in the posterior lung fields Abdominal:     Palpations: Abdomen is soft.     Tenderness: There is no abdominal tenderness. There is no right CVA tenderness, left CVA tenderness, guarding or rebound.  Musculoskeletal:        General: Tenderness present.     Cervical back: Normal range of motion and neck supple. No tenderness.     Right lower leg: No edema.     Left lower leg: No edema.     Comments: Crepitation of knees and mild joint line tenderness  Lymphadenopathy:     Cervical: No cervical adenopathy.  Skin:    General: Skin is warm and dry.  Neurological:     General: No focal deficit present.     Mental Status: She is alert and oriented to person, place, and time.  Psychiatric:        Mood and Affect: Mood normal.        Behavior: Behavior normal.     Comments: Slightly flattened affect but  interacts appropriately     BP 128/78 (BP Location: Left Arm, Patient Position: Sitting)   Pulse 80   Temp (!) 97.3 F (36.3 C)   Ht _0  (1.575 m)   Wt 148 lb 12.8 oz (67.5 kg)   SpO2 95%   BMI 27.22 kg/m  Wt Readings from Last 3 Encounters:  07/03/20 148 lb 12.8 oz (67.5 kg)  01/18/20 125 lb (56.7 kg)  11/27/19 137 lb (62.1 kg)     Health Maintenance Due  Topic Date Due  . MAMMOGRAM  Never done  . COLONOSCOPY  Never done  . COVID-19 Vaccine (2 - Pfizer 2-dose series) 01/25/2020  . DEXA SCAN  Never done  . PNA vac Low Risk Adult (1 of 2 - PCV13) 04/22/2020     Lab Results  Component Value  Date   TSH 1.091 03/31/2019   Lab Results  Component Value Date   WBC 11.5 (H) 01/18/2020   HGB 15.1 (H) 01/18/2020   HCT 44.7 01/18/2020   MCV 93.5 01/18/2020   PLT 267 01/18/2020   Lab Results  Component Value Date   NA 143 07/03/2020   K 4.8 07/03/2020   CO2 26 07/03/2020   GLUCOSE 183 (H) 07/03/2020   BUN 17 07/03/2020   CREATININE 0.75 07/03/2020   BILITOT <0.2 07/03/2020   ALKPHOS 70 07/03/2020   AST 9 07/03/2020   ALT 12 07/03/2020   PROT 6.3 07/03/2020   ALBUMIN 4.2 07/03/2020   CALCIUM 9.0 07/03/2020   ANIONGAP 6 01/18/2020   Lab Results  Component Value Date   CHOL 202 (H) 11/17/2019   Lab Results  Component Value Date   HDL 70 11/17/2019   Lab Results  Component Value Date   LDLCALC 112 (H) 11/17/2019   Lab Results  Component Value Date   TRIG 115 11/17/2019   Lab Results  Component Value Date   CHOLHDL 2.9 11/17/2019   Lab Results  Component Value Date   HGBA1C 8.3 (A) 07/03/2020      Assessment & Plan:  1. Type 2 diabetes mellitus without complication, with long-term current use of insulin (HCC) Random glucose of 219 and Hgb A1c of 8.3 which is  improved from 8.8 on 11/17/2019.  Endocrinology referral pending.  Patient will have comprehensive metabolic panel at today's visit. - POCT glucose (manual entry) - POCT glycosylated hemoglobin (Hb A1C) - Comprehensive metabolic panel  2. Bilateral hearing loss, unspecified hearing loss type No evidence of cerumen impaction or other findings that may be contributing to her hearing loss.  She will be referred to audiology for further evaluation and treatment. - Ambulatory referral to Audiology  3. Chronic pain of both knees - meloxicam (MOBIC) 15 MG tablet; Take 1 tablet (15 mg total) by mouth daily. After a meal as needed for knee pain  Dispense: 30 tablet; Refill: 1 - AMB referral to orthopedics - Arthritis Panel  4. COPD with acute exacerbation ([place]) Continue home respiratory medications  and start prescribed antibiotic. Go to urgent care or ED if any acute worsening of symptoms.  Prednisone not prescribed as this could further exacerbate patient's diabetes but she was also made aware that she can call for prednisone if symptoms are not improving.  Smoking cessation again encouraged. - doxycycline (VIBRA-TABS) 100 MG tablet; Take 1 tablet (100 mg total) by mouth 2 (two) times daily.  Dispense: 20 tablet; Refill: 0  5. Encounter for screening mammogram for malignant neoplasm of breast - MM Digital Screening; Future  6. Estrogen deficiency 7. Chronic bilateral low back pain, unspecified whether sciatica present Calcium and vitamin D supplements recommended as well as weight bearing exercise as tolerated - DG Bone Density; Future  8. Need for immunization against influenza - Flu Vaccine QUAD 36+ mos IM  9. Major depression, recurrent, chronic ([place])  Continue follow-up with psychiatry for ongoing management     Follow-up: Return in about 3 months (around 10/03/2020) for DM/chronic issues; sooner if needed; nurse visit for pneumonia vaccine.   Antony Blackbird, MD

## 2020-07-03 NOTE — Progress Notes (Signed)
Bilateral knee pain

## 2020-07-04 LAB — COMPREHENSIVE METABOLIC PANEL WITH GFR
ALT: 12 IU/L (ref 0–32)
AST: 9 IU/L (ref 0–40)
Albumin/Globulin Ratio: 2 (ref 1.2–2.2)
Albumin: 4.2 g/dL (ref 3.8–4.8)
Alkaline Phosphatase: 70 IU/L (ref 44–121)
BUN/Creatinine Ratio: 23 (ref 12–28)
BUN: 17 mg/dL (ref 8–27)
Bilirubin Total: <0.2 mg/dL (ref 0.0–1.2)
CO2: 26 mmol/L (ref 20–29)
Calcium: 9 mg/dL (ref 8.7–10.3)
Chloride: 109 mmol/L — ABNORMAL HIGH (ref 96–106)
Creatinine, Ser: 0.75 mg/dL (ref 0.57–1.00)
GFR calc Af Amer: 97 mL/min/1.73
GFR calc non Af Amer: 84 mL/min/1.73
Globulin, Total: 2.1 g/dL (ref 1.5–4.5)
Glucose: 183 mg/dL — ABNORMAL HIGH (ref 65–99)
Potassium: 4.8 mmol/L (ref 3.5–5.2)
Sodium: 143 mmol/L (ref 134–144)
Total Protein: 6.3 g/dL (ref 6.0–8.5)

## 2020-07-04 LAB — ARTHRITIS PANEL
Anti Nuclear Antibody (ANA): NEGATIVE
Rheumatoid fact SerPl-aCnc: 18.6 [IU]/mL — ABNORMAL HIGH (ref 0.0–13.9)
Sed Rate: 2 mm/h (ref 0–40)
Uric Acid: 4.6 mg/dL (ref 3.0–7.2)

## 2020-07-07 ENCOUNTER — Telehealth: Payer: Self-pay | Admitting: Family Medicine

## 2020-07-07 NOTE — Telephone Encounter (Signed)
Called pt , unable to reach, LV to call back.

## 2020-07-07 NOTE — Telephone Encounter (Signed)
Patient would like a return call in reference to her labs. I was unable to give the call to a [facility] nurse because it wasn't anything stating that it was okay. Please advise

## 2020-07-07 NOTE — Telephone Encounter (Signed)
I attempted to contact patient by phone at 5:29 PM on 07/07/2020 but the call went to voicemail.  No message was left.  Patient's labs are currently available through my chart.  Please also contact the patient on Monday when the office reopens to see if she still has any questions regarding her recent lab results.

## 2020-07-07 NOTE — Telephone Encounter (Signed)
Copied from CRM [id number]. Topic: General - Inquiry >> Jul 05, 2020  2:26 PM Adrian Prince D wrote: Reason for CRM: Patient called and said that the medication that was prescribed for her knee pain is not working. She would like to get something else. She is currently taking meloxicam and that's not helping any. She can be reached at [phone]. Please advise

## 2020-07-10 NOTE — Telephone Encounter (Signed)
Spoke w/ pt and answered questions re: lab results. Pt reports that she has scheduled a visit w/ [facility] for tomorrow but is still asking if she can get something stronger for the pain or to help her sleep, she states that meloxicam helps but not enough to sleep and the Seroquel only helps her sleep half of the night, she is wondering if doses should be increased for both meds, pls advise.

## 2020-07-10 NOTE — Telephone Encounter (Signed)
She is on the maximum dose of the Mobic.  She should keep her orthopedic follow-up to see if there are other interventions that they may suggest to help with her pain.  She may wish to try the use of over-the-counter melatonin along with her current Seroquel.  If she has a mental health provider, she should speak with the provider regarding increasing the dose of Seroquel

## 2020-07-11 ENCOUNTER — Ambulatory Visit (INDEPENDENT_AMBULATORY_CARE_PROVIDER_SITE_OTHER): Payer: Medicare HMO | Admitting: Family Medicine

## 2020-07-11 ENCOUNTER — Encounter: Payer: Self-pay | Admitting: Family Medicine

## 2020-07-11 ENCOUNTER — Other Ambulatory Visit: Payer: Self-pay

## 2020-07-11 DIAGNOSIS — M25562 Pain in left knee: Secondary | ICD-10-CM

## 2020-07-11 DIAGNOSIS — M25561 Pain in right knee: Secondary | ICD-10-CM

## 2020-07-11 DIAGNOSIS — G8929 Other chronic pain: Secondary | ICD-10-CM

## 2020-07-11 MED ORDER — TRAMADOL HCL 50 MG PO TABS
50.0000 mg | ORAL_TABLET | Freq: Four times a day (QID) | ORAL | 0 refills | Status: DC | PRN
Start: 2020-07-11 — End: 2020-07-28

## 2020-07-11 NOTE — Progress Notes (Signed)
I saw and examined the patient with Dr. Marga Hoots and agree with assessment and plan as outlined.    Bilateral knee pain, probably due to DJD.  Recent labs also showed positive RF.  Will inject each knee with cortisone today.  Tramadol as needed.    X-Rays if symptoms persist.

## 2020-07-11 NOTE — Telephone Encounter (Signed)
ATC pt, no answer, LM to RC, CRM created for follow-up. 

## 2020-07-11 NOTE — Progress Notes (Signed)
Office Visit Note   Patient: Alisha Roth           Date of Birth: 1955-10-26           MRN: [id number] Visit Date: 07/11/2020 Requested by: Cain Saupe, MD [address] [place],  [place] [address] PCP: Cain Saupe, MD  Subjective: Chief Complaint  Patient presents with  . Right Knee - Pain    Pain and some swelling in the right knee, but continues to have pain in the left knee. Both knees ache.  . Left Knee - Pain    HPI: 64yo F presenting to clinic with concerns of chronic bilateral knee pain for several months (unable to give exact timing). Patient states that her pain is somewhat worse on the right, but both of her knees cause her significant pain. Pain is worse with activity, but bothers her significantly at night as well. She says she was told she has Arthritis, and wants to know if there is something she can take to help improve her pain at night. No trauma, states her right knee with swell from time to time.               ROS:   All other systems were reviewed and are negative.  Objective: Vital Signs: There were no vitals taken for this visit.  Physical Exam:  General:  Alert and oriented, in no acute distress. Pulm:  Breathing unlabored. Psy:  Normal mood, congruent affect. Skin:  Bilateral knees without bruising or rashes. Overlying skin intact with no erythema.   KNEE EXAM:  General: Normal gait  Seated Exam:  Moderate patellar crepitus with knee flexion/extension.  Palpation: Endorses tenderness to palpation over medial and  lateral joint lines. Some tenderness with palpation of patella.  No tenderness over patellar tendon.  Supine exam: Trace effusion on right, normal patellar mobility.   Ligamentous Exam:  No pain or laxity with anterior/posterior drawer.  No obvious Sag.  No pain or laxity with varus/valgus stress across the knee.   Meniscus:  [place] with no pain or deep clicking.  Thessaly negative.    Imaging: None Today.   Assessment  & Plan: 64yo F presenting to clinic with chronic bilateral knee pain, R>L. Patient's examination and symptoms consistent with arthritic disease. Discussed knee exercises she can perform to improve her knee stabilization. Discussed anti-inflammatory medications vs injection therapy. Patient opted for injection therapy.  - Injections performed as described below, which patient tolerated very well.  - will provide very small amount of tramadol for pain - Strict return precautions discussed, otherwise F/U as needed.      Procedures: Knee Cortisone Injection:  Risks and benefits of procedure discussed, Patient opted to proceed. Verbal Consent obtained.  Timeout performed.  Skin prepped in a sterile fashion with betadine before further cleansing with alcohol. Ethyl Chloride was used for topical analgesia.  Left Knee was injected with 5cc 1% Lidocaine without epinephrine via the suprapatellar approach using a 25G, 1.5in needle. Syringe was removed from the needle, and 40mg  methylprednisolone was then injected into the joint.  Procedure was then repeated on the left knee.  Patient tolerated the injection well with no immediate complications. Aftercare instructions were discussed, and patient was given strict return precautions.      PMFS History: Patient Active Problem List   Diagnosis Date Noted  . Medication management 10/31/2019  . Stage 2 moderate COPD by GOLD classification (HCC) 10/26/2018  . SHOULDER PAIN, LEFT 07/13/2009  . TOBACCO ABUSE  12/26/2008  . SCABIES 09/01/2008  . 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 HEADACHE 10/20/2007  . DEPRESSION 05/25/2007  . ASTHMA 05/25/2007  . MENOPAUSAL SYNDROME 05/25/2007  . PLANTAR FASCITIS 12/04/2005  . Allergic rhinitis 11/20/2005   Past Medical History:  Diagnosis Date  . Asthma   . COPD (chronic obstructive pulmonary disease) (HCC)   . DM (diabetes mellitus) (HCC)   . Former smoker Quit 2016  . Obesity   . Seasonal allergies       Family History  Problem Relation Age of Onset  . Diabetes Mother   . Heart attack Mother   . Alcohol abuse Father   . Diabetes Sister   . Alcohol abuse Brother     Past Surgical History:  Procedure Laterality Date  . ABDOMINAL HYSTERECTOMY    . CESAREAN SECTION    . LAPAROSCOPIC CHOLECYSTECTOMY     Social History   Occupational History  . Not on file  Tobacco Use  . Smoking status: Current Every Day Smoker    Packs/day: 0.25    Types: Cigarettes  . Smokeless tobacco: Never Used  . Tobacco comment: 4-5 cigarettes a day  Vaping Use  . Vaping Use: Never used  Substance and Sexual Activity  . Alcohol use: Never  . Drug use: Never  . Sexual activity: Yes

## 2020-07-15 IMAGING — DX DG KNEE COMPLETE 4+V*L*
4 series · 4 of 4 positions shown · non-contrast
Comparison: None.

CLINICAL DATA: Recent fall with left knee pain, initial encounter

EXAM:
LEFT KNEE - COMPLETE 4+ VIEW

[knee ap (1 of 3)]
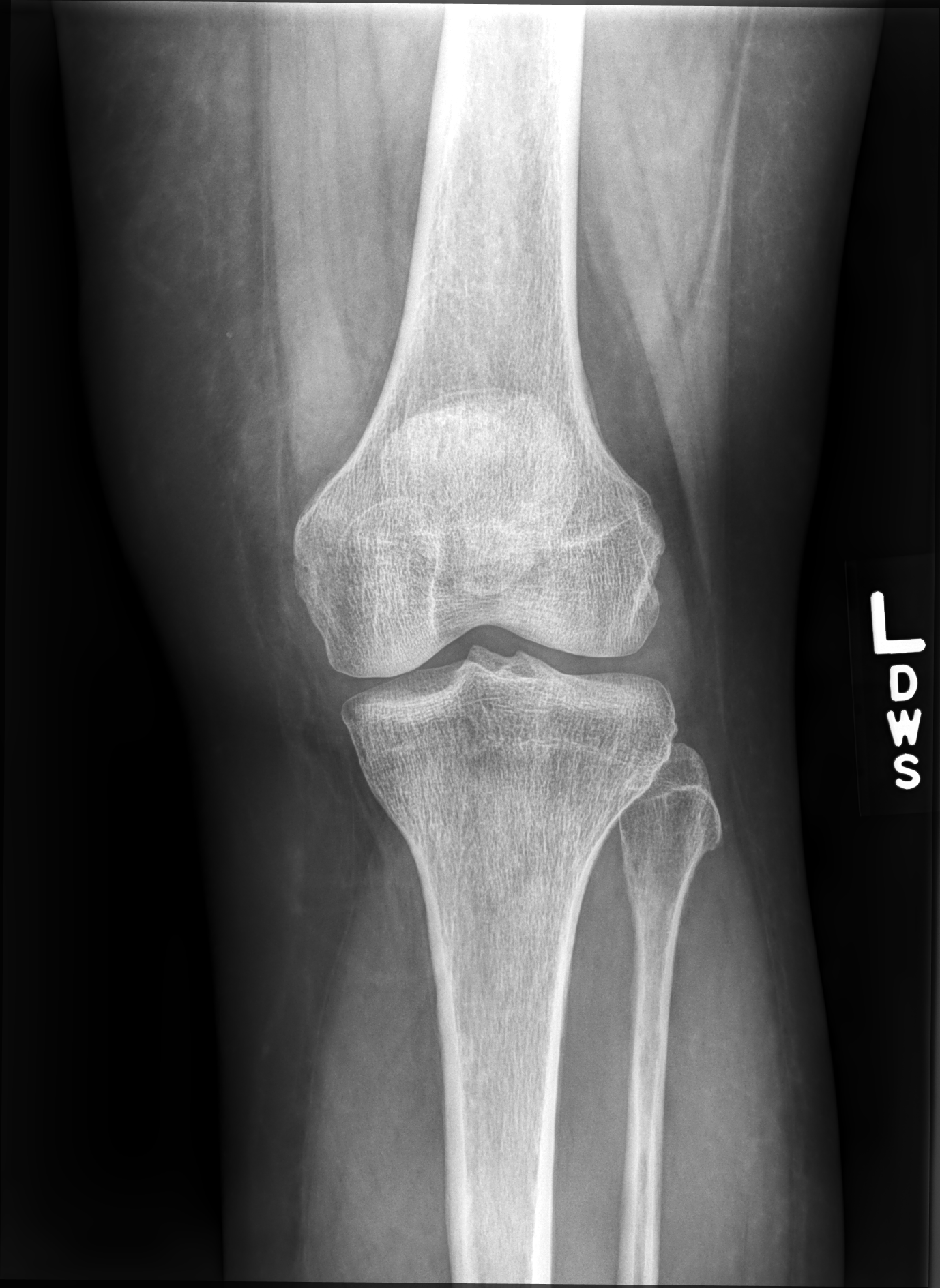

[knee ap (2 of 3)]
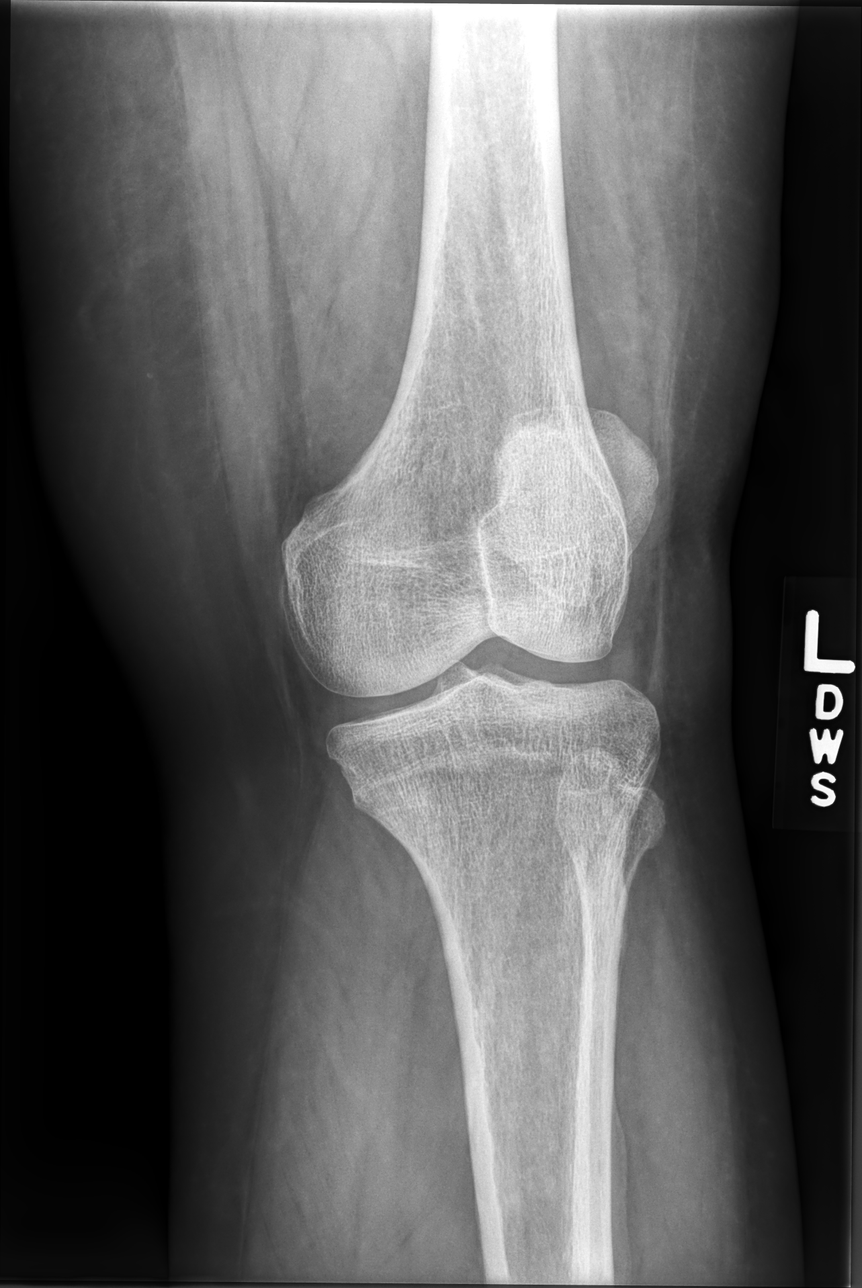

[knee ap (3 of 3)]
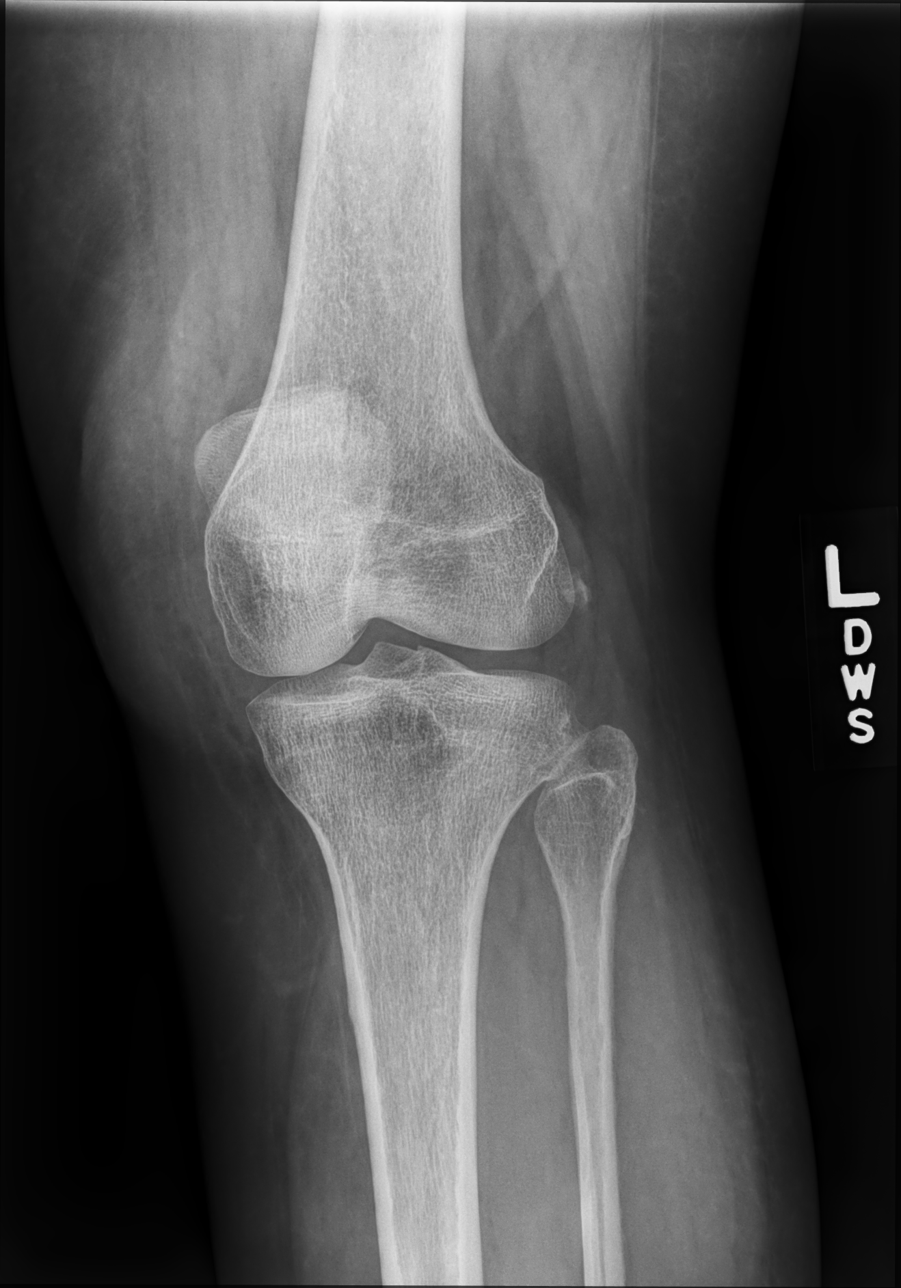

[knee lat]
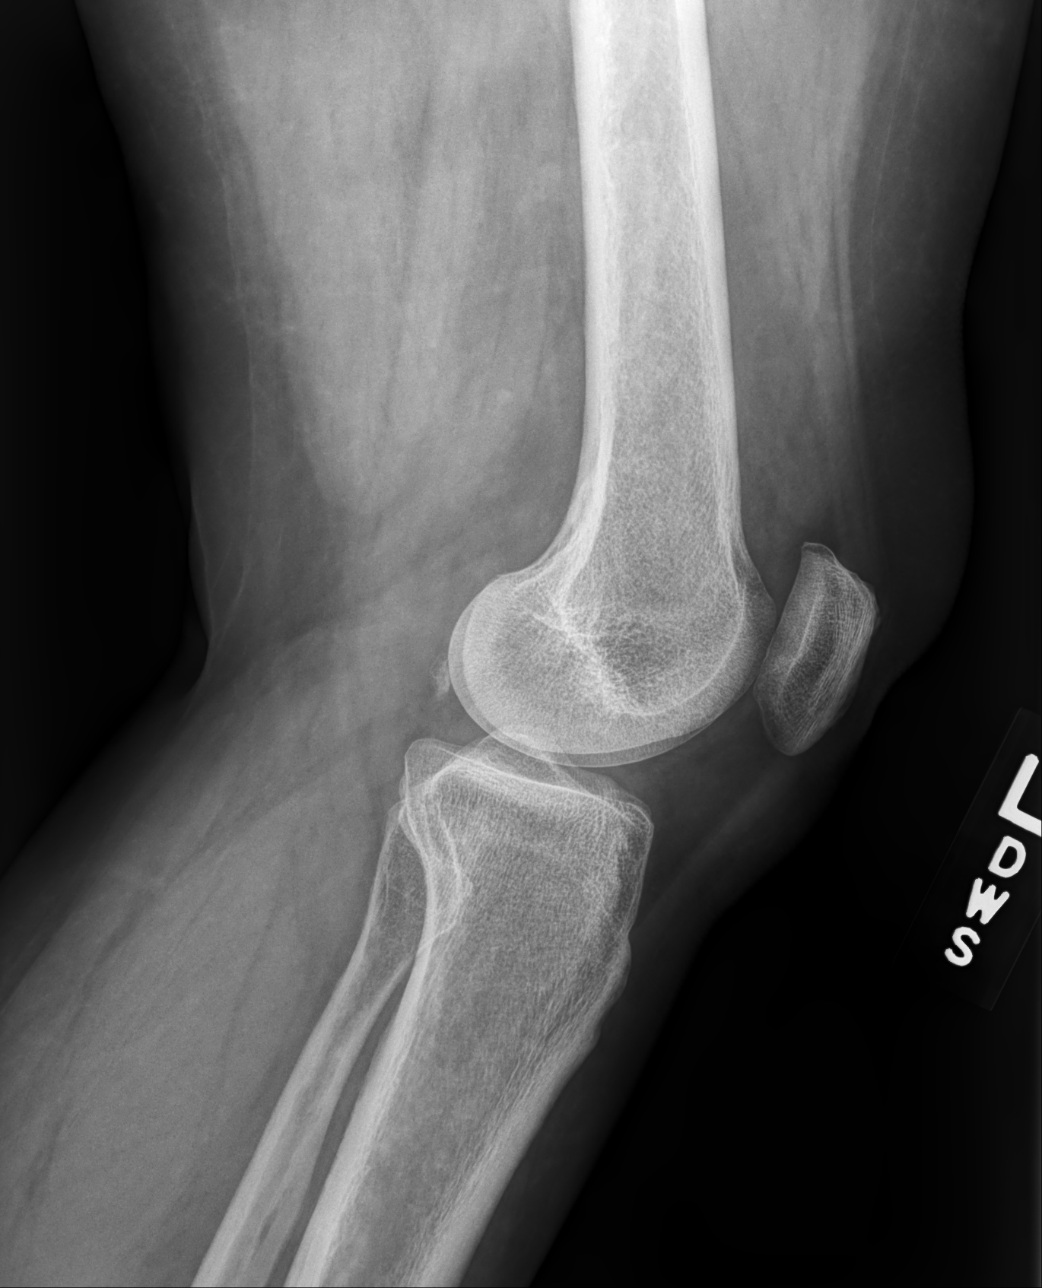

[4 of 4 positions shown; findings below may reference images not displayed]

FINDINGS: No evidence of fracture, dislocation, or joint effusion. No evidence
of arthropathy or other focal bone abnormality. Soft tissues are
unremarkable.
IMPRESSION: No acute abnormality noted.

## 2020-07-28 ENCOUNTER — Other Ambulatory Visit: Payer: Self-pay | Admitting: Family Medicine

## 2020-07-28 ENCOUNTER — Telehealth: Payer: Self-pay | Admitting: Family Medicine

## 2020-07-28 DIAGNOSIS — G8929 Other chronic pain: Secondary | ICD-10-CM

## 2020-07-28 MED ORDER — NABUMETONE 500 MG PO TABS: 500.0000 mg | ORAL_TABLET | Freq: Two times a day (BID) | ORAL | 3 refills | Status: DC | PRN

## 2020-07-28 NOTE — Telephone Encounter (Signed)
Pt called saying she still has BIL knee pain prior to the injections. Pt states she has also developed a knot on her right knee along with swelling in both knees.   pt asked if she can be prescribed anything different for pain

## 2020-07-28 NOTE — Telephone Encounter (Signed)
Please advise 

## 2020-07-28 NOTE — Telephone Encounter (Signed)
Relafen Rx sent.  Right knee MRI ordered.

## 2020-07-28 NOTE — Telephone Encounter (Signed)
I called and advised the patient. Her MRI has already been scheduled, for 12/07. The nabumetone will replace the meloxicam. Take it with food. She can continue the tramadol - both meds sent in today.

## 2020-08-01 ENCOUNTER — Ambulatory Visit: Payer: Medicare HMO | Admitting: Family Medicine

## 2020-08-02 ENCOUNTER — Other Ambulatory Visit: Payer: Self-pay | Admitting: Family Medicine

## 2020-08-02 DIAGNOSIS — F339 Major depressive disorder, recurrent, unspecified: Secondary | ICD-10-CM

## 2020-08-02 NOTE — Telephone Encounter (Signed)
Requested medication (s) are due for refill today: yes  Requested medication (s) are on the active medication list: yes  Last refill:  07/03/20 #30 3 refills   Future visit scheduled: no  Notes to clinic:  not delegated per protocol , pharmacy requesting 90 day prescription Dx code needed.     Requested Prescriptions  Pending Prescriptions Disp Refills   QUEtiapine (SEROQUEL) 25 MG tablet [Pharmacy Med Name: QUETIAPINE FUMARATE 25 MG TAB] 90 tablet 2    Sig: TAKE 1 TABLET BY MOUTH EVERYDAY AT BEDTIME      Not Delegated - Psychiatry:  Antipsychotics - Second Generation (Atypical) - quetiapine Failed - 08/02/2020 10:35 AM      Failed - This refill cannot be delegated      Passed - ALT in normal range and within 180 days    ALT  Date Value Ref Range Status  07/03/2020 12 0 - 32 IU/L Final          Passed - AST in normal range and within 180 days    AST  Date Value Ref Range Status  07/03/2020 9 0 - 40 IU/L Final          Passed - Completed PHQ-2 or PHQ-9 in the last 360 days      Passed - Last BP in normal range    BP Readings from Last 1 Encounters:  07/03/20 128/78          Passed - Valid encounter within last 6 months    Recent Outpatient Visits           1 month ago Type 2 diabetes mellitus without complication, with long-term current use of insulin (HCC)   [facility] [place], [place], MD   9 months ago Type 2 diabetes mellitus without complication, with long-term current use of insulin (HCC)    [facility] [place], [place], MD

## 2020-08-02 NOTE — Telephone Encounter (Signed)
Will forward to pcp

## 2020-08-18 ENCOUNTER — Telehealth: Payer: Self-pay | Admitting: Pulmonary Disease

## 2020-08-18 NOTE — Telephone Encounter (Signed)
I have called and LM for pt on her VM for her to call back about the medication that she needs refills on.  We have her on the spiriva respimat but she is asking for the capsules for the spiriva. We need to clarify which one she is taking.

## 2020-08-21 ENCOUNTER — Other Ambulatory Visit: Payer: Medicare HMO

## 2020-08-22 MED ORDER — SPIRIVA RESPIMAT 2.5 MCG/ACT IN AERS: 2.0000 | INHALATION_SPRAY | Freq: Every day | RESPIRATORY_TRACT | 5 refills | Status: DC

## 2020-08-22 NOTE — Telephone Encounter (Signed)
Called and spoke to pt. Pt states she is only taking the Spiriva respimat and not the handihaler. Spriva respimat rx sent to preferred pharmacy. Pt verbalized understanding and denied any further questions or concerns at this time.

## 2020-08-24 ENCOUNTER — Telehealth: Payer: Self-pay | Admitting: Family Medicine

## 2020-08-24 MED ORDER — TRAMADOL HCL 50 MG PO TABS: 50.0000 mg | ORAL_TABLET | Freq: Four times a day (QID) | ORAL | 0 refills | Status: DC | PRN

## 2020-08-24 NOTE — Telephone Encounter (Signed)
I called and advised the patient. 

## 2020-08-24 NOTE — Telephone Encounter (Signed)
Please advise 

## 2020-08-24 NOTE — Telephone Encounter (Signed)
Pt called stating she would like a refill of her tramadol to help with the pain until her MRI on 09/06/20 and she would like to be notified when it's called in.  [phone]

## 2020-08-24 NOTE — Telephone Encounter (Signed)
Rx sent 

## 2020-08-30 ENCOUNTER — Other Ambulatory Visit: Payer: Self-pay | Admitting: Pulmonary Disease

## 2020-08-30 MED ORDER — SPIRIVA RESPIMAT 2.5 MCG/ACT IN AERS: 2.0000 | INHALATION_SPRAY | Freq: Every day | RESPIRATORY_TRACT | 5 refills | Status: DC

## 2020-09-01 ENCOUNTER — Encounter: Payer: Self-pay | Admitting: Pulmonary Disease

## 2020-09-01 ENCOUNTER — Other Ambulatory Visit: Payer: Self-pay

## 2020-09-01 ENCOUNTER — Ambulatory Visit (INDEPENDENT_AMBULATORY_CARE_PROVIDER_SITE_OTHER): Payer: Medicare HMO | Admitting: Pulmonary Disease

## 2020-09-01 VITALS — BP 124/74 | HR 90 | Temp 98.1°F | Ht 61.0 in | Wt 145.8 lb

## 2020-09-01 DIAGNOSIS — F17200 Nicotine dependence, unspecified, uncomplicated: Secondary | ICD-10-CM | POA: Diagnosis not present

## 2020-09-01 DIAGNOSIS — J449 Chronic obstructive pulmonary disease, unspecified: Secondary | ICD-10-CM

## 2020-09-01 DIAGNOSIS — Z0000 Encounter for general adult medical examination without abnormal findings: Secondary | ICD-10-CM | POA: Insufficient documentation

## 2020-09-01 MED ORDER — NICOTINE 21 MG/24HR TD PT24: 21.0000 mg | MEDICATED_PATCH | Freq: Every day | TRANSDERMAL | 3 refills | Status: DC

## 2020-09-01 MED ORDER — ALBUTEROL SULFATE (2.5 MG/3ML) 0.083% IN NEBU: 2.5000 mg | INHALATION_SOLUTION | Freq: Four times a day (QID) | RESPIRATORY_TRACT | 6 refills | Status: DC | PRN

## 2020-09-01 MED ORDER — NICOTINE POLACRILEX 4 MG MT LOZG: 4.0000 mg | LOZENGE | OROMUCOSAL | 3 refills | Status: DC | PRN

## 2020-09-01 NOTE — Assessment & Plan Note (Signed)
Current smoker Smoking 6 to 7 cigarettes a day Over 30+ pack year smoking history Established with lung cancer screening program, completed shared decision making visit, did not obtain lung cancer screening CT Has had success with stopping smoking the past with nicotine replacement therapies  Plan: Encouraged patient to start reducing her smoking to get down to 0 cigarettes a day Patient has a quit date for 09/16/2020 Nicotine replacement therapies prescribed today We will send message to lung cancer screening team to get patient rescheduled for lung cancer screening CT that was missed in March/2022

## 2020-09-01 NOTE — Assessment & Plan Note (Signed)
Plan: Finish doxycycline prednisone as outlined by primary care Continue Wixela Continue Spiriva Respimat 2.5 Keep upcoming appointment with Dr. Tonia Brooms in January/2022 Message to be sent to lung cancer screening team to reschedule patient for lung cancer screening CT that was missed Albuterol nebulized medications to be refilled Encourage patient to obtain COVID-19 booster Strongly emphasized need to stop smoking Nicotine replacement therapies prescribed

## 2020-09-01 NOTE — Progress Notes (Signed)
PCCM: Thanks for seeing her today Josephine Igo, DO [facility] 09/01/2020 5:34 PM

## 2020-09-01 NOTE — Progress Notes (Signed)
_0  ID: Alisha Roth, female    DOB: 03-28-1956, 64 y.o.   MRN: [id number]  Chief Complaint  Patient presents with  . Follow-up    Cough      Referring provider: Antony Blackbird, MD  HPI:  64 year old female current smoker followed in our office for COPD  Past medical history: Depression, allergic rhinitis Smoking history: Current smoker. 6-7 cigarettes. 38.25 pack year smoker Maintenance: Wixela 250, Spiriva Respimat 2.5 Patient of Dr. Valeta Harms  09/01/2020  - Visit   64 year old female current everyday smoker followed in our office for COPD.  She is followed by Dr. Valeta Harms.  Patient presented today to our office as a walk-in she was confused and when her next follow-up should be with Dr. Valeta Harms.  Patient was last seen in our office for shared decision making visit in March/2021 by SG NP.  She was last seen by Dr. Valeta Harms in March/2021.  Plan of care from that office visit was as follows: Continue Symbicort and Spiriva Respimat.  She was given samples of Trelegy Ellipta.  She was also referred to lung cancer screening program who strongly recommended this office visit that she stop smoking.  Patient completed lung cancer screening shared decision making visit but did not complete lung cancer screening CT.  We will discuss and review this today.  Patient was recently evaluated by primary care last week and was started on doxycycline and prednisone.  She has not taken those dosages today.  She feels it is upsetting her stomach.  We will discuss and review this.  She reports adherence to [facility].  Her Symbicort was no longer covered by insurance.  As well Spiriva Respimat 2.5.  She continues to smoke 6 to 7 cigarettes a day.  She wants to quit on January/09/2020.  She is used nicotine replacement therapies in the past and had moderate success.  She is had her first 2 COVID-19 vaccinations.  She has received her seasonal flu vaccine as well as her pneumonia vaccinations.  She plans on  receiving her COVID-19 booster when she is feeling better.   Questionaires / Pulmonary Flowsheets:   ACT:  No flowsheet data found.  MMRC: mMRC Dyspnea Scale mMRC Score  09/01/2020 3    Epworth:  No flowsheet data found.  Tests:   Pulmonary tests:  PFT 10/26/18 >> FEV1 1.34 (60%), FEV1% 68, TLC 4.17 (90%), DLCO 76%, + BD  Sleep tests:  PSG 06/11/18 >> AHI 9.8, SpO2 low 77%  01/18/2020-chest x-ray-no active cardiopulmonary disease  FENO:  Lab Results  Component Value Date   NITRICOXIDE 16 10/26/2018    PFT: PFT Results Latest Ref Rng & Units 10/26/2018  FVC-Pre L 1.63  FVC-Predicted Pre % 56  FVC-Post L 1.97  FVC-Predicted Post % 68  Pre FEV1/FVC % % 72  Post FEV1/FCV % % 68  FEV1-Pre L 1.17  FEV1-Predicted Pre % 53  FEV1-Post L 1.34  DLCO uncorrected ml/min/mmHg 13.71  DLCO UNC% % 76  DLCO corrected ml/min/mmHg 14.47  DLCO COR %Predicted % 80  DLVA Predicted % 115  TLC L 4.17  TLC % Predicted % 90  RV % Predicted % 125    WALK:  No flowsheet data found.  Imaging: No results found.  Lab Results:  CBC    Component Value Date/Time   WBC 11.5 (H) 01/18/2020 1604   RBC 4.78 01/18/2020 1604   HGB 15.1 (H) 01/18/2020 1604   HGB 11.8 10/05/2018 0953   HCT 44.7 01/18/2020 1604  HCT 35.9 10/05/2018 0953   PLT 267 01/18/2020 1604   PLT 313 10/05/2018 0953   MCV 93.5 01/18/2020 1604   MCV 86 10/05/2018 0953   MCH 31.6 01/18/2020 1604   MCHC 33.8 01/18/2020 1604   RDW 13.4 01/18/2020 1604   RDW 13.5 10/05/2018 0953   LYMPHSABS 2.2 01/18/2020 1604   LYMPHSABS 1.8 10/05/2018 0953   MONOABS 0.6 01/18/2020 1604   EOSABS 0.2 01/18/2020 1604   EOSABS 0.1 10/05/2018 0953   BASOSABS 0.1 01/18/2020 1604   BASOSABS 0.0 10/05/2018 0953    BMET    Component Value Date/Time   NA 143 07/03/2020 1140   K 4.8 07/03/2020 1140   CL 109 (H) 07/03/2020 1140   CO2 26 07/03/2020 1140   GLUCOSE 183 (H) 07/03/2020 1140   GLUCOSE 222 (H) 01/18/2020 1604   BUN 17  07/03/2020 1140   CREATININE 0.75 07/03/2020 1140   CALCIUM 9.0 07/03/2020 1140   GFRNONAA 84 07/03/2020 1140   GFRAA 97 07/03/2020 1140    BNP No results found for: BNP  ProBNP No results found for: PROBNP  Specialty Problems      Pulmonary Problems   Allergic rhinitis    Qualifier: Diagnosis of  By: Radene Ou MD, Jordan Hawks        Stage 2 moderate COPD by GOLD classification ([place])      Allergies  Allergen Reactions  . Metformin And Related Diarrhea    Immunization History  Administered Date(s) Administered  . Influenza Whole 09/01/2008, 11/09/2009  . Influenza,inj,Quad PF,6+ Mos 06/17/2019, 07/03/2020  . Influenza-Unspecified 08/03/2018  . PFIZER SARS-COV-2 Vaccination 01/04/2020, 02/07/2020  . Pneumococcal Polysaccharide-23 11/09/2009  . Td 05/16/2018    Past Medical History:  Diagnosis Date  . Asthma   . COPD (chronic obstructive pulmonary disease) ([place])   . DM (diabetes mellitus) ([place])   . Former smoker Quit 2016  . Obesity   . Seasonal allergies     Tobacco History: Social History   Tobacco Use  Smoking Status Current Every Day Smoker  . Packs/day: 0.75  . Years: 51.00  . Pack years: 38.25  . Types: Cigarettes  . Start date: 09/16/1968  Smokeless Tobacco Never Used  Tobacco Comment   6-7 cigarettes smoked daily 09/01/20 ARJ    Ready to quit: No Counseling given: Yes Comment: 6-7 cigarettes smoked daily 09/01/20 ARJ   Smoking assessment and cessation counseling  Patient currently smoking:6-7 cigarettes I have advised the patient to quit/stop smoking as soon as possible due to high risk for multiple medical problems.  It will also be very difficult for [place] to manage patient's  respiratory symptoms and status if we continue to expose her lungs to a known irritant.  We do not advise e-cigarettes as a form of stopping smoking.  Patient is willing to quit smoking. She has set quit date on 09/16/20  Patient is agreeable to nicotine replacement  therapies.  I have advised the patient that we can assist and have options of nicotine replacement therapy, provided smoking cessation education today, provided smoking cessation counseling, and provided cessation resources.  Follow-up next office visit office visit for assessment of smoking cessation.    Smoking cessation counseling advised for:32mn    Outpatient Encounter Medications as of 09/01/2020  Medication Sig  . Accu-Chek FastClix Lancets MISC Use to check blood sugars up to 4 times per day  . ACCU-CHEK GUIDE test strip USE AS DIRECTED UP TO 4 TIMES DAILY. DX: E11.9, Z79.4  . albuterol (PROVENTIL HFA)  108 (90 Base) MCG/ACT inhaler Inhale 2 puffs into the lungs every 6 (six) hours as needed for wheezing or shortness of breath.  . B-D UF III MINI PEN NEEDLES 31G X 5 MM MISC USE 1 PEN TO INJECT INSULIN AS PRESCRIBED DAILY. E11.9 TYPE 2 DIABETES  . baclofen (LIORESAL) 10 MG tablet Take 0.5-1 tablets (5-10 mg total) by mouth 3 (three) times daily as needed for muscle spasms.  . Blood Glucose Monitoring Suppl (ACCU-CHEK GUIDE) w/Device KIT 1 kit by Does not apply route 4 (four) times daily as needed. To monitor blood sugars  . busPIRone (BUSPAR) 10 MG tablet Take 10 mg by mouth 2 (two) times daily as needed.  . cetirizine (ZYRTEC) 10 MG tablet Take 1 tablet (10 mg total) by mouth daily.  . Cholecalciferol (VITAMIN D-3) 125 MCG (5000 UT) TABS Take 1 tablet by mouth daily.  . diclofenac sodium (VOLTAREN) 1 % GEL Apply 4 g topically 4 (four) times daily.  Marland Kitchen doxycycline (VIBRA-TABS) 100 MG tablet Take 1 tablet (100 mg total) by mouth 2 (two) times daily.  . DULoxetine (CYMBALTA) 60 MG capsule Take 1 capsule (60 mg total) by mouth 2 (two) times daily.  Marland Kitchen erythromycin ophthalmic ointment Place 1 application into the left eye 4 (four) times daily.  . hydrOXYzine (ATARAX/VISTARIL) 50 MG tablet Take 1 tablet (50 mg total) by mouth 3 (three) times daily as needed for itching.  . liraglutide  (VICTOZA) 18 MG/3ML SOPN Inject 0.3 mLs (1.8 mg total) into the skin daily. Takes in daily morning  . lisinopril (ZESTRIL) 10 MG tablet TAKE 1 TABLET BY MOUTH EVERY DAY  . LORazepam (ATIVAN) 0.5 MG tablet Take 0.5 mg by mouth every 8 (eight) hours as needed for anxiety. PCP will not provide refills.  . Magnesium 400 MG CAPS Take 400 mg by mouth daily.  . meloxicam (MOBIC) 15 MG tablet Take 1 tablet (15 mg total) by mouth daily. After a meal as needed for knee pain  . Menatetrenone (VITAMIN K2) 100 MCG TABS Take 100 mcg by mouth daily.  . montelukast (SINGULAIR) 10 MG tablet Take 1 tablet (10 mg total) by mouth at bedtime.  . nabumetone (RELAFEN) 500 MG tablet Take 1 tablet (500 mg total) by mouth 2 (two) times daily as needed.  Marland Kitchen omeprazole (PRILOSEC) 40 MG capsule Take 1 capsule (40 mg total) by mouth daily.  Marland Kitchen oxyCODONE-acetaminophen (PERCOCET) 5-325 MG tablet Take 1 tablet by mouth every 4 (four) hours as needed for severe pain.  . predniSONE (DELTASONE) 10 MG tablet Take 1 tablet (10 mg total) by mouth daily.  . QUEtiapine (SEROQUEL) 25 MG tablet TAKE 1 TABLET BY MOUTH EVERYDAY AT BEDTIME  . rosuvastatin (CRESTOR) 10 MG tablet TAKE 1 TABLET BY MOUTH DAILY. TO LOWER CHOLESTEROL  . Tiotropium Bromide Monohydrate (SPIRIVA RESPIMAT) 2.5 MCG/ACT AERS Inhale 2 puffs into the lungs daily.  . traMADol (ULTRAM) 50 MG tablet Take 1 tablet (50 mg total) by mouth every 6 (six) hours as needed.  . TRESIBA FLEXTOUCH 100 UNIT/ML SOPN FlexTouch Pen INJECT 20 UNITS INTO THE SKIN DAILY.  . valACYclovir (VALTREX) 500 MG tablet Take 1 tablet (500 mg total) by mouth daily.  . [DISCONTINUED] albuterol (PROVENTIL) (2.5 MG/3ML) 0.083% nebulizer solution Take 3 mLs (2.5 mg total) by nebulization every 6 (six) hours as needed for wheezing or shortness of breath.  Marland Kitchen albuterol (PROVENTIL) (2.5 MG/3ML) 0.083% nebulizer solution Take 3 mLs (2.5 mg total) by nebulization every 6 (six) hours as needed for  wheezing or  shortness of breath.  . budesonide-formoterol (SYMBICORT) 160-4.5 MCG/ACT inhaler Inhale 2 puffs into the lungs 2 (two) times daily. (Patient not taking: Reported on 09/01/2020)  . nicotine (NICODERM CQ) 21 mg/24hr patch Place 1 patch (21 mg total) onto the skin daily.  . nicotine polacrilex (COMMIT) 4 MG lozenge Take 1 lozenge (4 mg total) by mouth as needed for smoking cessation.   No facility-administered encounter medications on file as of 09/01/2020.     Review of Systems  Review of Systems  Constitutional: Positive for fatigue. Negative for activity change and fever.  HENT: Negative for sinus pressure, sinus pain and sore throat.   Respiratory: Positive for wheezing. Negative for cough and shortness of breath.   Cardiovascular: Negative for chest pain and palpitations.  Gastrointestinal: Positive for nausea and vomiting. Negative for diarrhea.  Musculoskeletal: Negative for arthralgias.  Neurological: Negative for dizziness.  Psychiatric/Behavioral: Negative for sleep disturbance. The patient is not nervous/anxious.      Physical Exam  BP 124/74 (BP Location: Right Arm, Cuff Size: Normal)   Pulse 90   Temp 98.1 F (36.7 C)   Ht _0  (1.549 m)   Wt 145 lb 12.8 oz (66.1 kg)   SpO2 98%   BMI 27.55 kg/m   Wt Readings from Last 5 Encounters:  09/01/20 145 lb 12.8 oz (66.1 kg)  07/03/20 148 lb 12.8 oz (67.5 kg)  01/18/20 125 lb (56.7 kg)  11/27/19 137 lb (62.1 kg)  11/18/19 138 lb 9.6 oz (62.9 kg)    BMI Readings from Last 5 Encounters:  09/01/20 27.55 kg/m  07/03/20 27.22 kg/m  01/18/20 22.86 kg/m  11/27/19 25.06 kg/m  11/18/19 25.35 kg/m     Physical Exam Vitals and nursing note reviewed.  Constitutional:      General: She is not in acute distress.    Appearance: Normal appearance. She is normal weight.  HENT:     Head: Normocephalic and atraumatic.     Right Ear: External ear normal.     Left Ear: External ear normal.     Nose: Nose normal. No  congestion.     Mouth/Throat:     Mouth: Mucous membranes are moist.     Pharynx: Oropharynx is clear.  Eyes:     Pupils: Pupils are equal, round, and reactive to light.  Cardiovascular:     Rate and Rhythm: Normal rate and regular rhythm.     Pulses: Normal pulses.     Heart sounds: Normal heart sounds. No murmur heard.   Pulmonary:     Effort: Pulmonary effort is normal. No respiratory distress.     Breath sounds: No decreased air movement. Wheezing present. No decreased breath sounds or rales.  Musculoskeletal:     Cervical back: Normal range of motion.  Skin:    General: Skin is warm and dry.     Capillary Refill: Capillary refill takes less than 2 seconds.  Neurological:     General: No focal deficit present.     Mental Status: She is alert and oriented to person, place, and time. Mental status is at baseline.     Gait: Gait normal.  Psychiatric:        Mood and Affect: Mood normal.        Behavior: Behavior normal.        Thought Content: Thought content normal.        Judgment: Judgment normal.       Assessment & Plan:   Stage  2 moderate COPD by GOLD classification ([place]) Plan: Finish doxycycline prednisone as outlined by primary care Continue Wixela Continue Spiriva Respimat 2.5 Keep upcoming appointment with Dr. Valeta Harms in January/2022 Message to be sent to lung cancer screening team to reschedule patient for lung cancer screening CT that was missed Albuterol nebulized medications to be refilled Encourage patient to obtain COVID-19 booster Strongly emphasized need to stop smoking Nicotine replacement therapies prescribed  Healthcare maintenance Plan: Recommend COVID-19 booster We will follow up with primary care to ensure patient has received Pneumovax 23  TOBACCO ABUSE Current smoker Smoking 6 to 7 cigarettes a day Over 30+ pack year smoking history Established with lung cancer screening program, completed shared decision making visit, did not obtain  lung cancer screening CT Has had success with stopping smoking the past with nicotine replacement therapies  Plan: Encouraged patient to start reducing her smoking to get down to 0 cigarettes a day Patient has a quit date for 09/16/2020 Nicotine replacement therapies prescribed today We will send message to lung cancer screening team to get patient rescheduled for lung cancer screening CT that was missed in March/2022    Return in about 4 weeks (around 09/29/2020), or if symptoms worsen or fail to improve, for Follow up with Dr. Valeta Harms.   Lauraine Rinne, NP 09/01/2020   This appointment required 32 minutes of patient care (this includes precharting, chart review, review of results, face-to-face care, etc.).

## 2020-09-01 NOTE — Assessment & Plan Note (Signed)
Plan: Recommend COVID-19 booster We will follow up with primary care to ensure patient has received Pneumovax 23

## 2020-09-01 NOTE — Patient Instructions (Addendum)
You were seen today by Coral Ceo, NP  for:   1. Stage 2 moderate COPD by GOLD classification (HCC)  Finish prednisone as outlined by primary care  Finish doxycycline as outlined by primary care  Continue Wixela 250  Spiriva Respimat 2.5 >>> 2 puffs daily >>> Do this every day >>>This is not a rescue inhaler  Note your daily symptoms > remember "red flags" for COPD:   >>>Increase in cough >>>increase in sputum production >>>increase in shortness of breath or activity  intolerance.   If you notice these symptoms, please call the office to be seen.   2. TOBACCO ABUSE  - nicotine (NICODERM CQ) 21 mg/24hr patch; Place 1 patch (21 mg total) onto the skin daily.  Dispense: 28 patch; Refill: 3 - nicotine polacrilex (COMMIT) 4 MG lozenge; Take 1 lozenge (4 mg total) by mouth as needed for smoking cessation.  Dispense: 108 tablet; Refill: 3  We recommend that you stop smoking.  >>>You need to set a quit date >>>If you have friends or family who smoke, let them know you are trying to quit and not to smoke around you or in your living environment  We will send a message to lung cancer screening team to get you rescheduled for your lung cancer screening CT that was missed in March/2021  Smoking Cessation Resources:  1 800 QUIT NOW  >>> Patient to call this resource and utilize it to help support her quit smoking >>> Keep up your hard work with stopping smoking  You can also contact the [facility] >>>For smoking cessation classes call [phone]  We do not recommend using e-cigarettes as a form of stopping smoking  You can sign up for smoking cessation support texts and information:  >>>https://smokefree.gov/smokefreetxt   Nicotine patches: >>>Make sure you rotate sites that you do not get skin irritation, Apply 1 patch each morning to a non-hairy skin site  If you are smoking greater than 10 cigarettes/day and weigh over 45 kg start with the nicotine patch of  21 mg a day for 6 weeks, then 14 mg a day for 2 weeks, then finished with 7 mg a day for 2 weeks, then stop  If you are smoking less than 10 cigarettes a day or weight less than 45 kg start with medium dose pack of 14 mg a day for 6 weeks, followed by 7 mg a day for 2 weeks   >>>If insomnia occurs you are having trouble sleeping you can take the patch off at night, and place a new one on in the morning >>>If the patch is removed at night and you have morning cravings start short acting nicotine replacement therapy such as gum or lozenges  >>>Avoid acidic beverages such as coffee, carbonated beverages before and during gum use.  A soft acidic beverages lower oral pH which cause nicotine to not be absorbed properly >>>If you chew the gum too quickly or vigorously you could have nausea, vomiting, abdominal pain, constipation, hiccups, headache, sore jaw, mouth irritation ulcers  Nicotine lozenge: Lozenges are commonly uses short acting NRT product  >>>Smokers who smoke within 30 minutes of awakening should use 4 mg dose >>>Smokers who wait more than 30 minutes after awakening to smoke should use 2 mg dose  Can use up to 1 lozenge every 1-2 hours for 6 weeks >>>Total amount of lozenges that can be used per day as 20 >>>Gradually reduce number of lozenges used per day after 2 weeks of use  Place lozenge in mouth and allowed to dissolve for 30 minutes loss and does not need to be chewed  Lozenges have advantages to be able to be used in people with TMG, poor dentition, dentures   3. Healthcare maintenance  Complete COVID-19 booster vaccination after finishing prednisone and doxycycline, lease wait 1 to 2 weeks   We recommend today:   Meds ordered this encounter  Medications  . nicotine (NICODERM CQ) 21 mg/24hr patch    Sig: Place 1 patch (21 mg total) onto the skin daily.    Dispense:  28 patch    Refill:  3  . nicotine polacrilex (COMMIT) 4 MG lozenge    Sig: Take 1 lozenge (4 mg  total) by mouth as needed for smoking cessation.    Dispense:  108 tablet    Refill:  3    Follow Up:    Return in about 4 weeks (around 09/29/2020), or if symptoms worsen or fail to improve, for Follow up with Dr. Tonia Brooms.   Notification of test results are managed in the following manner: If there are  any recommendations or changes to the  plan of care discussed in office today,  we will contact you and let you know what they are. If you do not hear from [place], then your results are normal and you can view them through your  MyChart account , or a letter will be sent to you. Thank you again for trusting [place] with your care  - Thank you, [facility]    It is flu season:   >>> Best ways to protect herself from the flu: Receive the yearly flu vaccine, practice good hand hygiene washing with soap and also using hand sanitizer when available, eat a nutritious meals, get adequate rest, hydrate appropriately       Please contact the office if your symptoms worsen or you have concerns that you are not improving.   Thank you for choosing [facility] for your healthcare, and for allowing [place] to partner with you on your healthcare journey. I am thankful to be able to provide care to you today.   Elisha Headland FNP-C    Health Risks of Smoking Smoking cigarettes is very bad for your health. Tobacco smoke has over 200 known poisons in it. It contains the poisonous gases nitrogen oxide and carbon monoxide. There are over 60 chemicals in tobacco smoke that cause cancer. Smoking is difficult to quit because a chemical in tobacco, called nicotine, causes addiction or dependence. When you smoke and inhale, nicotine is absorbed rapidly into the bloodstream through your lungs. Both inhaled and non-inhaled nicotine may be addictive. What are the risks of cigarette smoke? Cigarette smokers have an increased risk of many serious medical problems, including:  Lung cancer.  Lung disease, such as  pneumonia, bronchitis, and emphysema.  Chest pain (angina) and heart attack because the heart is not getting enough oxygen.  Heart disease and peripheral blood vessel disease.  High blood pressure (hypertension).  Stroke.  Oral cancer, including cancer of the lip, mouth, or voice box.  Bladder cancer.  Pancreatic cancer.  Cervical cancer.  Pregnancy complications, including premature birth.  Stillbirths and smaller newborn babies, birth defects, and genetic damage to sperm.  Early menopause.  Lower estrogen level for women.  Infertility.  Facial wrinkles.  Blindness.  Increased risk of broken bones (fractures).  Senile dementia.  Stomach ulcers and internal bleeding.  Delayed wound healing and increased risk of complications during surgery.  Even smoking lightly shortens your life expectancy by several years. Because of secondhand smoke exposure, children of smokers have an increased risk of the following:  Sudden infant death syndrome (SIDS).  Respiratory infections.  Lung cancer.  Heart disease.  Ear infections. What are the benefits of quitting? There are many health benefits of quitting smoking. Here are some of them:  Within days of quitting smoking, your risk of having a heart attack decreases, your blood flow improves, and your lung capacity improves. Blood pressure, pulse rate, and breathing patterns start returning to normal soon after quitting.  Within months, your lungs may clear up completely.  Quitting for 10 years reduces your risk of developing lung cancer and heart disease to almost that of a nonsmoker.  People who quit may see an improvement in their overall quality of life. How do I quit smoking?     Smoking is an addiction with both physical and psychological effects, and longtime habits can be hard to change. Your health care provider can recommend:  Programs and community resources, which may include group support, education, or  talk therapy.  Prescription medicines to help reduce cravings.  Nicotine replacement products, such as patches, gum, and nasal sprays. Use these products only as directed. Do not replace cigarette smoking with electronic cigarettes, which are commonly called e-cigarettes. The safety of e-cigarettes is not known, and some may contain harmful chemicals.  A combination of two or more of these methods. Where to find more information  American Lung Association: www.lung.org  American Cancer Society: www.cancer.org Summary  Smoking cigarettes is very bad for your health. Cigarette smokers have an increased risk of many serious medical problems, including several cancers, heart disease, and stroke.  Smoking is an addiction with both physical and psychological effects, and longtime habits can be hard to change.  By stopping right away, you can greatly reduce the risk of medical problems for you and your family.  To help you quit smoking, your health care provider can recommend programs, community resources, prescription medicines, and nicotine replacement products such as patches, gum, and nasal sprays. This information is not intended to replace advice given to you by your health care provider. Make sure you discuss any questions you have with your health care provider. Document Revised: 12/04/2017 Document Reviewed: 09/06/2016 Elsevier Patient Education  2020 [employer].

## 2020-09-04 ENCOUNTER — Telehealth: Payer: Self-pay | Admitting: Pulmonary Disease

## 2020-09-04 NOTE — Telephone Encounter (Signed)
disregard

## 2020-09-06 ENCOUNTER — Ambulatory Visit
Admission: RE | Admit: 2020-09-06 | Discharge: 2020-09-06 | Disposition: A | Discharge status: Home / Self Care | Payer: Medicare HMO | Source: Ambulatory Visit | Attending: Family Medicine | Admitting: Family Medicine

## 2020-09-06 ENCOUNTER — Telehealth: Payer: Self-pay | Admitting: Family Medicine

## 2020-09-06 DIAGNOSIS — G8929 Other chronic pain: Secondary | ICD-10-CM

## 2020-09-06 NOTE — Telephone Encounter (Signed)
MRI shows arthritis in the knee.  It's not severe, but is enough to explain her pain.  No indication for surgery at this point.  Treatment options would include:  - Referral to physical therapy.  - Request approval for gel injections.

## 2020-09-06 NOTE — Telephone Encounter (Signed)
I called and advised the patient of the results. She would like to try the gel injections. Also, she has only a few tramadol left and would like to know if she can have this refilled. She says she only takes it for the severe pain and still takes nabumetone at least once daily.

## 2020-09-07 ENCOUNTER — Telehealth: Payer: Self-pay | Admitting: Family Medicine

## 2020-09-07 DIAGNOSIS — M1712 Unilateral primary osteoarthritis, left knee: Secondary | ICD-10-CM

## 2020-09-07 DIAGNOSIS — M1711 Unilateral primary osteoarthritis, right knee: Secondary | ICD-10-CM

## 2020-09-07 MED ORDER — TRAMADOL HCL 50 MG PO TABS: 50.0000 mg | ORAL_TABLET | Freq: Four times a day (QID) | ORAL | 0 refills | Status: DC | PRN

## 2020-09-07 NOTE — Addendum Note (Signed)
Addended by: Lillia Carmel on: 09/07/2020 08:38 AM   Modules accepted: Orders

## 2020-09-07 NOTE — Telephone Encounter (Signed)
Rx sent.  Gel approval requested.

## 2020-09-07 NOTE — Telephone Encounter (Signed)
Requesting gel injections.

## 2020-09-07 NOTE — Telephone Encounter (Signed)
I called and advised the patient. 

## 2020-09-12 NOTE — Telephone Encounter (Signed)
Noted  

## 2020-10-02 ENCOUNTER — Other Ambulatory Visit: Payer: Self-pay | Admitting: Family Medicine

## 2020-10-02 ENCOUNTER — Other Ambulatory Visit: Payer: Self-pay

## 2020-10-02 ENCOUNTER — Encounter: Payer: Self-pay | Admitting: Pulmonary Disease

## 2020-10-02 ENCOUNTER — Ambulatory Visit (INDEPENDENT_AMBULATORY_CARE_PROVIDER_SITE_OTHER): Payer: Medicare HMO | Admitting: Pulmonary Disease

## 2020-10-02 DIAGNOSIS — J4521 Mild intermittent asthma with (acute) exacerbation: Secondary | ICD-10-CM

## 2020-10-02 DIAGNOSIS — F17210 Nicotine dependence, cigarettes, uncomplicated: Secondary | ICD-10-CM

## 2020-10-02 DIAGNOSIS — B009 Herpesviral infection, unspecified: Secondary | ICD-10-CM

## 2020-10-02 DIAGNOSIS — J441 Chronic obstructive pulmonary disease with (acute) exacerbation: Secondary | ICD-10-CM

## 2020-10-02 MED ORDER — SPIRIVA RESPIMAT 2.5 MCG/ACT IN AERS: 2.0000 | INHALATION_SPRAY | Freq: Every day | RESPIRATORY_TRACT | 5 refills | Status: DC

## 2020-10-02 MED ORDER — ALBUTEROL SULFATE HFA 108 (90 BASE) MCG/ACT IN AERS: 2.0000 | INHALATION_SPRAY | Freq: Four times a day (QID) | RESPIRATORY_TRACT | 5 refills | Status: AC | PRN

## 2020-10-02 MED ORDER — ALBUTEROL SULFATE (2.5 MG/3ML) 0.083% IN NEBU: 2.5000 mg | INHALATION_SOLUTION | Freq: Four times a day (QID) | RESPIRATORY_TRACT | 6 refills | Status: AC | PRN

## 2020-10-02 MED ORDER — FLUTICASONE-SALMETEROL 250-50 MCG/DOSE IN AEPB: 1.0000 | INHALATION_SPRAY | Freq: Two times a day (BID) | RESPIRATORY_TRACT | 5 refills | Status: AC

## 2020-10-02 MED ORDER — MONTELUKAST SODIUM 10 MG PO TABS: 10.0000 mg | ORAL_TABLET | Freq: Every day | ORAL | 11 refills | Status: AC

## 2020-10-02 NOTE — Telephone Encounter (Signed)
Requested medication (s) are due for refill today: Yes  Requested medication (s) are on the active medication list: Yes  Last refill:  07/19/19  Future visit scheduled: No  Notes to clinic:  Unable to refill, expired      Requested Prescriptions  Pending Prescriptions Disp Refills   valACYclovir (VALTREX) 500 MG tablet 90 tablet 3    Sig: Take 1 tablet (500 mg total) by mouth daily.      Antimicrobials:  Antiviral Agents - Anti-Herpetic Passed - 10/02/2020 10:15 AM      Passed - Valid encounter within last 12 months    Recent Outpatient Visits           3 months ago Type 2 diabetes mellitus without complication, with long-term current use of insulin (HCC)   [facility] [place], [place], MD   11 months ago Type 2 diabetes mellitus without complication, with long-term current use of insulin (HCC)   [facility] [place], [place], MD       Future Appointments             In 1 week [place], Marzella Schlein, PA-C [facility]

## 2020-10-02 NOTE — Progress Notes (Signed)
Smoking Cessation Counseling:   The patient's current tobacco use: 30+ years, currently down to 7-8 cigarettes per day. The patient was advised to quit and impact of smoking on their health.  I assessed the patient's willingness to attempt to quit. I provided methods and skills for cessation. She has ordered nicotine patches through her insurance. Her [employer] is paying for them.  We reviewed medication management of smoking session drugs if appropriate. Resources to help quit smoking were provided. A smoking cessation quit date was set: 10/02/2020 (She states that she has 6 cigarettes left and she doesn't plan on buying anymore) Follow-up was arranged in our clinic.  The amount of time spent counseling patient was 4 mins separate of telephone encounter time.    Josephine Igo, DO [facility] 10/02/2020 9:57 AM

## 2020-10-02 NOTE — Telephone Encounter (Signed)
Medication:  valACYclovir (VALTREX) 500 MG tablet [[id number]]   Has the patient contacted their pharmacy? Yes   (Agent: If yes, when and what did the pharmacy advise?) to call the office   Preferred Pharmacy (with phone number or street name):  [id number] [place], [place] - [address]  [address] [place] [address]  Phone:  [phone] Fax:  [phone] Agent: Please be advised that RX refills may take up to 3 business days. We ask that you follow-up with your pharmacy.

## 2020-10-02 NOTE — Progress Notes (Signed)
Virtual Visit via Telephone Note  I connected with Alisha Roth on 10/02/20 at 10:15 AM EST by telephone and verified that I am speaking with the correct person using two identifiers.  Location: Patient: Home Provider: Office    I discussed the limitations, risks, security and privacy concerns of performing an evaluation and management service by telephone and the availability of in person appointments. I also discussed with the patient that there may be a patient responsible charge related to this service. The patient expressed understanding and agreed to proceed.   History of Present Illness:  PMH of Asthma diagnosed as child, Former smoker, quit 2016, teenager to 2016, 50 + years, 0.5ppd unless stressed smoked she smoked more. Never had lung cancer screening imagining. Had PFTs within this past year. No current respiratory symptoms. She gets around ok on her own. Walking she sometimes gets out of breath. She was told she had COPD but not sure how bad.  Patient does describe a history of a bad exacerbation this past year.  She was seen by her prior pulmonologist who placed her on azithromycin Monday Wednesday and Friday.  Unfortunately, the patient has zero recollection of any of her prior medical history.  For example she her documentation here at [facility] suggest that she had a laparoscopic cholecystectomy and she has no memory of ever this happening. And most of this has to be probed out of her with questioning.  She is very confused on most of her medications but what I believe she is on currently is Symbicort, Spiriva and albuterol nebulizer as needed for shortness of breath and wheezing.  She also is currently taking an antibiotic Monday Wednesday and Friday which I presume is azithromycin.  She brought none of her medications to the office today and she brought no prior medical documentation either.  Overall we will need to request documentation from her prior pulmonologist as well as her  current PCP.  For her current respiratory symptoms.  She does not have any except for a daily cough.  Is nonproductive, no hemoptysis. No wheezing.  OV 11/18/2019: here for follow up. She has been able to cut down to 4 cigarettes daily. She has been trying to use her inhalers regularly. She was given samples of breztri but this was expensive.  Further questioning on patient's inhaler use makes me question whether or not she is using them appropriately at home or just kind of randomly as needed.  She states though that she does feel better when using her Symbicort.  Unfortunately she is still smoking.  Counseled again today on the importance of smoking cessation.  We also talked about lung cancer screening today and her smoking history.   OV 10/02/2020: Telephone visit Roth to inclement weather.  Patient was last seen in our office by Elisha Headland, NP.  Managed with Symbicort plus Spiriva Respimat.  Has been given samples of Trelegy in the past.  Unfortunately a current everyday smoker patient was counseled then on smoking cessation.  She had a shared decision making visit and March 2021 for lung cancer screening however did not complete lung cancer screening CT imaging.  Per patient she plans to quit smoking today.  She says she only has 6 cigarettes left and she does not plan on purchasing anymore.  She has ordered nicotine patches through her insurance company in which she plans to use they have not come in mail yet.  She has gotten over her recent exacerbation and no longer is having  any significant respiratory complaints except her daily cough.  We discussed that her cough would likely get better after she quit smoking.    Observations/Objective:  PFT Results Latest Ref Rng & Units 10/26/2018  FVC-Pre L 1.63  FVC-Predicted Pre % 56  FVC-Post L 1.97  FVC-Predicted Post % 68  Pre FEV1/FVC % % 72  Post FEV1/FCV % % 68  FEV1-Pre L 1.17  FEV1-Predicted Pre % 53  FEV1-Post L 1.34  DLCO uncorrected  ml/min/mmHg 13.71  DLCO UNC% % 76  DLCO corrected ml/min/mmHg 14.47  DLCO COR %Predicted % 80  DLVA Predicted % 115  TLC L 4.17  TLC % Predicted % 90  RV % Predicted % 125     Assessment and Plan:  This is a 65 year old female, current every day smoker with plans to quit.  Stage II COPD.  Currently managed with triple therapy inhaler regimen Wixela plus Spiriva Respimat.  Additionally for her allergies she takes montelukast daily and occasionally uses her albuterol inhaler.  She also has an albuterol nebulizer.  Plan: Patient needs refills of all of her medications today. Additionally smoking cessation counseling was given.  Please see separate documentation regarding this.  Time spent counseling separate of encounter time. We discussed appropriate inhaler usage.  Discussed inhaler compliance. Please see orders below  Meds ordered this encounter  Medications  . albuterol (PROVENTIL HFA) 108 (90 Base) MCG/ACT inhaler    Sig: Inhale 2 puffs into the lungs every 6 (six) hours as needed for wheezing or shortness of breath.    Dispense:  6.7 g    Refill:  5  . albuterol (PROVENTIL) (2.5 MG/3ML) 0.083% nebulizer solution    Sig: Take 3 mLs (2.5 mg total) by nebulization every 6 (six) hours as needed for wheezing or shortness of breath.    Dispense:  75 mL    Refill:  6    DX J44.9  . Tiotropium Bromide Monohydrate (SPIRIVA RESPIMAT) 2.5 MCG/ACT AERS    Sig: Inhale 2 puffs into the lungs daily.    Dispense:  4 g    Refill:  5  . montelukast (SINGULAIR) 10 MG tablet    Sig: Take 1 tablet (10 mg total) by mouth at bedtime.    Dispense:  30 tablet    Refill:  11  . Fluticasone-Salmeterol (WIXELA INHUB) 250-50 MCG/DOSE AEPB    Sig: Inhale 1 puff into the lungs 2 (two) times daily.    Dispense:  60 each    Refill:  5    Follow Up Instructions:  6 month RTC for COPD follow up with Dr. Tonia Brooms or APP    I discussed the assessment and treatment plan with the patient. The patient was  provided an opportunity to ask questions and all were answered. The patient agreed with the plan and demonstrated an understanding of the instructions.   The patient was advised to call back or seek an in-person evaluation if the symptoms worsen or if the condition fails to improve as anticipated.  I provided 22 minutes of non-face-to-face time during this encounter.   Josephine Igo, DO

## 2020-10-03 MED ORDER — VALACYCLOVIR HCL 500 MG PO TABS: 500.0000 mg | ORAL_TABLET | Freq: Every day | ORAL | 0 refills | Status: DC

## 2020-10-11 ENCOUNTER — Other Ambulatory Visit: Payer: Self-pay

## 2020-10-11 ENCOUNTER — Ambulatory Visit: Payer: Medicare HMO | Attending: Physician Assistant | Admitting: Physician Assistant

## 2020-10-11 ENCOUNTER — Encounter: Payer: Medicare HMO | Admitting: Acute Care

## 2020-10-11 ENCOUNTER — Ambulatory Visit: Payer: Medicaid Other

## 2020-10-11 NOTE — Progress Notes (Deleted)
Shared Decision Making Visit Lung Cancer Screening Program [id number])   Eligibility:  Age 65 y.o.  Pack Years Smoking History Calculation 37 pack year smoking history (# packs/per year x # years smoked)  Recent History of coughing up blood  no  Unexplained weight loss? no ( >Than 15 pounds within the last 6 months )  Prior History Lung / other cancer no (Diagnosis within the last 5 years already requiring surveillance chest CT Scans).  Smoking Status Current Smoker  Former Smokers: Years since quit: NA  Quit Date: NA  Visit Components:  Discussion included one or more decision making aids. yes  Discussion included risk/benefits of screening. yes  Discussion included potential follow up diagnostic testing for abnormal scans. yes  Discussion included meaning and risk of over diagnosis. yes  Discussion included meaning and risk of False Positives. yes  Discussion included meaning of total radiation exposure. yes  Counseling Included:  Importance of adherence to annual lung cancer LDCT screening. yes  Impact of comorbidities on ability to participate in the program. yes  Ability and willingness to under diagnostic treatment. yes  Smoking Cessation Counseling:  Current Smokers:   Discussed importance of smoking cessation. yes  Information about tobacco cessation classes and interventions provided to patient. yes  Patient provided with "ticket" for LDCT Scan. yes  Symptomatic Patient. no  Counseling  Diagnosis Code: Tobacco Use Z72.0  Asymptomatic Patient yes  Counseling (Intermediate counseling: > three minutes counseling) R4431  Former Smokers:   Discussed the importance of maintaining cigarette abstinence. yes  Diagnosis Code: Personal History of Nicotine Dependence. V40.086  Information about tobacco cessation classes and interventions provided to patient. Yes  Patient provided with "ticket" for LDCT Scan. yes  Written Order for Lung Cancer  Screening with LDCT placed in Epic. Yes (CT Chest Lung Cancer Screening Low Dose W/O CM) [id number] Z12.2-Screening of respiratory organs Z87.891-Personal history of nicotine dependence  I have spent 25 minutes of face to face time with Ms. Labree discussing the risks and benefits of lung cancer screening. We viewed a power point together that explained in detail the above noted topics. We paused at intervals to allow for questions to be asked and answered to ensure understanding.We discussed that the single most powerful action that she can take to decrease her risk of developing lung cancer is to quit smoking. We discussed whether or not she is ready to commit to setting a quit date. We discussed options for tools to aid in quitting smoking including nicotine replacement therapy, non-nicotine medications, support groups, Quit Smart classes, and behavior modification. We discussed that often times setting smaller, more achievable goals, such as eliminating 1 cigarette a day for a week and then 2 cigarettes a day for a week can be helpful in slowly decreasing the number of cigarettes smoked. This allows for a sense of accomplishment as well as providing a clinical benefit. I gave her the " Be Stronger Than Your Excuses" card with contact information for community resources, classes, free nicotine replacement therapy, and access to mobile apps, text messaging, and on-line smoking cessation help. I have also given her my card and contact information in the event she needs to contact me. We discussed the time and location of the scan, and that either Abigail Miyamoto RN or I will call with the results within 24-48 hours of receiving them. I have offered her  a copy of the power point we viewed  as a resource in the event they need reinforcement of  the concepts we discussed today in the office. The patient verbalized understanding of all of  the above and had no further questions upon leaving the office. They have my  contact information in the event they have any further questions.  I spent 3 minutes counseling on smoking cessation and the health risks of continued tobacco abuse.  I explained to the patient that there has been a high incidence of coronary artery disease noted on these exams. I explained that this is a non-gated exam therefore degree or severity cannot be determined. This patient is currently on statin therapy. I have asked the patient to follow-up with their PCP regarding any incidental finding of coronary artery disease and management with diet or medication as their PCP  feels is clinically indicated. The patient verbalized understanding of the above and had no further questions upon completion of the visit.      Bevelyn Ngo, NP 10/11/2020

## 2020-10-18 ENCOUNTER — Telehealth: Payer: Self-pay

## 2020-10-18 NOTE — Telephone Encounter (Signed)
Submitted VOB, Monovisc, right knee. 

## 2020-10-25 ENCOUNTER — Telehealth: Payer: Self-pay

## 2020-10-25 ENCOUNTER — Inpatient Hospital Stay: Admission: RE | Admit: 2020-10-25 | Payer: Medicaid Other | Source: Ambulatory Visit

## 2020-10-25 NOTE — Telephone Encounter (Signed)
PA submitted online through [employer] for Monovisc, right knee. PA Pending# [id number]

## 2020-10-27 ENCOUNTER — Telehealth: Payer: Self-pay

## 2020-10-27 NOTE — Telephone Encounter (Signed)
Called and left a VM for patient to CB to schedule an appointment with Dr. Prince Rome for gel injection.  Approved, Monovisc, right knee. Buy & Bill Patient is covered through her insurance. No Co-pay PA Approval# [id number] Valid 10/25/2020- 04/24/2021

## 2020-11-08 ENCOUNTER — Other Ambulatory Visit: Payer: Self-pay | Admitting: Family Medicine

## 2020-11-08 DIAGNOSIS — B009 Herpesviral infection, unspecified: Secondary | ICD-10-CM

## 2020-11-09 ENCOUNTER — Other Ambulatory Visit: Payer: Self-pay | Admitting: Obstetrics and Gynecology

## 2020-11-09 DIAGNOSIS — F32A Depression, unspecified: Secondary | ICD-10-CM

## 2020-11-30 ENCOUNTER — Other Ambulatory Visit: Payer: Self-pay | Admitting: Family Medicine

## 2020-11-30 DIAGNOSIS — B009 Herpesviral infection, unspecified: Secondary | ICD-10-CM

## 2020-11-30 NOTE — Telephone Encounter (Signed)
Courtesy refill  

## 2020-12-15 ENCOUNTER — Other Ambulatory Visit: Payer: Self-pay | Admitting: Family Medicine

## 2020-12-15 DIAGNOSIS — B009 Herpesviral infection, unspecified: Secondary | ICD-10-CM

## 2020-12-15 NOTE — Telephone Encounter (Signed)
Requested medication (s) are due for refill today: Yes  Requested medication (s) are on the active medication list: Yes  Last refill:  2 weeks ago  Future visit scheduled: No  Notes to clinic:  Courtesy refill already given, appointment needed, routing to office for approval     Requested Prescriptions  Pending Prescriptions Disp Refills   valACYclovir (VALTREX) 500 MG tablet [Pharmacy Med Name: VALACYCLOVIR HCL 500 MG TABLET] 90 tablet 1    Sig: TAKE 1 TABLET (500 MG TOTAL) BY MOUTH DAILY.      Antimicrobials:  Antiviral Agents - Anti-Herpetic Passed - 12/15/2020  4:43 PM      Passed - Valid encounter within last 12 months    Recent Outpatient Visits           5 months ago Type 2 diabetes mellitus without complication, with long-term current use of insulin (HCC)   [facility] [place], [place], MD   1 year ago Type 2 diabetes mellitus without complication, with long-term current use of insulin (HCC)   [facility] [place], [place], MD

## 2021-05-07 ENCOUNTER — Other Ambulatory Visit: Payer: Self-pay | Admitting: Family Medicine

## 2021-05-07 DIAGNOSIS — B009 Herpesviral infection, unspecified: Secondary | ICD-10-CM

## 2021-05-07 NOTE — Telephone Encounter (Signed)
  Notes to clinic:  Medication last filled on 03/03/2021 for 30 day supply Review for continued use and refill    Requested Prescriptions  Pending Prescriptions Disp Refills   valACYclovir (VALTREX) 500 MG tablet [Pharmacy Med Name: VALACYCLOVIR HCL 500MG  TABS] 30 tablet 0    Sig: TAKE ONE TABLET BY MOUTH EVERY DAY     Antimicrobials:  Antiviral Agents - Anti-Herpetic Passed - 05/07/2021  9:25 AM      Passed - Valid encounter within last 12 months    Recent Outpatient Visits           10 months ago Type 2 diabetes mellitus without complication, with long-term current use of insulin (HCC)   [facility] [place], [place], MD   1 year ago Type 2 diabetes mellitus without complication, with long-term current use of insulin (HCC)   [facility] [place], [place], MD       Future Appointments             In 1 month [place], [place], PA-C [facility]

## 2021-05-07 NOTE — Telephone Encounter (Signed)
Pt following up on refill request. Advised pt she needs appt.  Pt has moved to [place].  I asked pt if she had a dr there, and she said yes. She also told me she has appt with her dr in [place] tomorrow.   Advised pt she cannot have 2 primary care providers, and she should ask her dr in [place] to fill that prescription.  Pt verbalized understanding.   I also asked pt if I could update her new address, and she changed the subject and hung up.

## 2021-06-13 ENCOUNTER — Ambulatory Visit: Payer: Medicare HMO | Admitting: Physician Assistant

## 2024-10-08 ENCOUNTER — Ambulatory Visit: Attending: Nurse Practitioner | Admitting: Nurse Practitioner

## 2024-10-08 VITALS — BP 130/78 | HR 77 | Ht 62.0 in | Wt 174.0 lb

## 2024-10-08 DIAGNOSIS — R0609 Other forms of dyspnea: Secondary | ICD-10-CM | POA: Diagnosis not present

## 2024-10-08 DIAGNOSIS — R1013 Epigastric pain: Secondary | ICD-10-CM | POA: Diagnosis not present

## 2024-10-08 DIAGNOSIS — E118 Type 2 diabetes mellitus with unspecified complications: Secondary | ICD-10-CM

## 2024-10-08 DIAGNOSIS — E785 Hyperlipidemia, unspecified: Secondary | ICD-10-CM

## 2024-10-08 DIAGNOSIS — I10 Essential (primary) hypertension: Secondary | ICD-10-CM | POA: Diagnosis not present

## 2024-10-08 DIAGNOSIS — R072 Precordial pain: Secondary | ICD-10-CM | POA: Diagnosis not present

## 2024-10-08 DIAGNOSIS — F0390 Unspecified dementia without behavioral disturbance: Secondary | ICD-10-CM

## 2024-10-08 DIAGNOSIS — R002 Palpitations: Secondary | ICD-10-CM | POA: Diagnosis not present

## 2024-10-08 DIAGNOSIS — R9431 Abnormal electrocardiogram [ECG] [EKG]: Secondary | ICD-10-CM | POA: Diagnosis not present

## 2024-10-08 DIAGNOSIS — J449 Chronic obstructive pulmonary disease, unspecified: Secondary | ICD-10-CM

## 2024-10-08 DIAGNOSIS — Z87891 Personal history of nicotine dependence: Secondary | ICD-10-CM | POA: Diagnosis not present

## 2024-10-08 DIAGNOSIS — I2510 Atherosclerotic heart disease of native coronary artery without angina pectoris: Secondary | ICD-10-CM | POA: Diagnosis not present

## 2024-10-08 MED ORDER — METOPROLOL TARTRATE 100 MG PO TABS: 100.0000 mg | ORAL_TABLET | Freq: Once | ORAL | 0 refills | Status: AC

## 2024-10-08 NOTE — Progress Notes (Unsigned)
 "  Office Visit    Patient Name: Alisha Roth Date of Encounter: 10/08/2024  Primary Care Provider:  Alec House, MD Primary Cardiologist:  None  Chief Complaint    69 year old female with a history of coronary artery calcification noted on prior CT, hypertension, hyperlipidemia, thymus cyst, pulmonary nodule, type 2 diabetes, COPD, asthma, GERD, depression, dementia, and tobacco use who presents for heart first clinic new patient evaluation.  Past Medical History    Past Medical History:  Diagnosis Date   Asthma    COPD (chronic obstructive pulmonary disease) (HCC)    DM (diabetes mellitus) (HCC)    Former smoker Quit 2016   Obesity    Seasonal allergies    Past Surgical History:  Procedure Laterality Date   ABDOMINAL HYSTERECTOMY     CESAREAN SECTION     LAPAROSCOPIC CHOLECYSTECTOMY      Allergies  Allergies[1]   Labs/Other Studies Reviewed    The following studies were reviewed today:     Recent Labs: No results found for requested labs within last 365 days.  Recent Lipid Panel    Component Value Date/Time   CHOL 202 (H) 11/17/2019 0912   TRIG 115 11/17/2019 0912   HDL 70 11/17/2019 0912   CHOLHDL 2.9 11/17/2019 0912   CHOLHDL 2.5 Ratio 10/20/2007 2137   VLDL 22 10/20/2007 2137   LDLCALC 112 (H) 11/17/2019 0912    History of Present Illness    69 year old female with the above past medical history including coronary artery calcification noted on prior CT, hypertension, hyperlipidemia, thymus cyst, pulmonary nodule, type 2 diabetes, COPD, asthma, GERD, depression, dementia, and tobacco use who presents for heart first clinic new patient evaluation.  She saw her PCP on 09/28/2024 and reported epigastric pain, shortness of breath, abnormal EKG.  EKG showed normal sinus rhythm, 85 bpm, nonspecific ST/T wave abnormality, likely artifact. She was referred to cardiology for further evaluation.  She presents today for heart first clinic new patient evaluation  accompanied by her grandson's fiance.  She recently moved to [place]  [place] of [place].  Patient is a somewhat difficult historian in the setting of dementia.  She reports a several month history of epigastric pain, mild shortness of breath with activity, she is not new (patient has a history of COPD/asthma).  She reports rare fleeting palpitations in the setting of emotional stress. She denies any new exertional symptoms. She drinks less than 50 mg of caffeine a day.  She does not exercise.  She is independent in her daily activities.  She lives with her son and his fiance.  She does not work.  She does have a history of prior tobacco use, she has smoked half a pack per day since the age of 47, she quit smoking approximately 2 weeks ago.  Overall, she reports feeling well.  Home Medications    Current Outpatient Medications  Medication Sig Dispense Refill   Accu-Chek FastClix Lancets MISC Use to check blood sugars up to 4 times per day 120 each prn   ACCU-CHEK GUIDE test strip USE AS DIRECTED UP TO 4 TIMES DAILY. DX: E11.9, Z79.4 200 each 1   albuterol  (PROVENTIL  HFA) 108 (90 Base) MCG/ACT inhaler Inhale 2 puffs into the lungs every 6 (six) hours as needed for wheezing or shortness of breath. 6.7 g 5   albuterol  (PROVENTIL ) (2.5 MG/3ML) 0.083% nebulizer solution Take 3 mLs (2.5 mg total) by nebulization every 6 (six) hours as needed for wheezing or shortness of breath. 75  mL 6   B-D UF III MINI PEN NEEDLES 31G X 5 MM MISC USE 1 PEN TO INJECT INSULIN  AS PRESCRIBED DAILY. E11.9 TYPE 2 DIABETES 200 each 3   baclofen  (LIORESAL ) 10 MG tablet Take 0.5-1 tablets (5-10 mg total) by mouth 3 (three) times daily as needed for muscle spasms. 30 each 3   Blood Glucose Monitoring Suppl (ACCU-CHEK GUIDE) w/Device KIT 1 kit by Does not apply route 4 (four) times daily as needed. To monitor blood sugars 1 kit 0   budesonide -formoterol  (SYMBICORT ) 160-4.5 MCG/ACT inhaler Inhale 2 puffs into the lungs 2 (two)  times daily. (Patient not taking: Reported on 09/01/2020) 10.2 g 0   busPIRone (BUSPAR) 10 MG tablet Take 10 mg by mouth 2 (two) times daily as needed.     cetirizine  (ZYRTEC ) 10 MG tablet Take 1 tablet (10 mg total) by mouth daily. 60 tablet 3   Cholecalciferol (VITAMIN D -3) 125 MCG (5000 UT) TABS Take 1 tablet by mouth daily. 90 tablet 3   diclofenac  sodium (VOLTAREN ) 1 % GEL Apply 4 g topically 4 (four) times daily. 100 g 0   doxycycline  (VIBRA -TABS) 100 MG tablet Take 1 tablet (100 mg total) by mouth 2 (two) times daily. 20 tablet 0   DULoxetine  (CYMBALTA ) 60 MG capsule Take 1 capsule (60 mg total) by mouth 2 (two) times daily. 30 capsule 0   erythromycin  ophthalmic ointment Place 1 application into the left eye 4 (four) times daily. 3.5 g 0   Fluticasone -Salmeterol (WIXELA INHUB) 250-50 MCG/DOSE AEPB Inhale 1 puff into the lungs 2 (two) times daily. 60 each 5   hydrOXYzine  (ATARAX /VISTARIL ) 50 MG tablet Take 1 tablet (50 mg total) by mouth 3 (three) times daily as needed for itching. 15 tablet 0   liraglutide  (VICTOZA ) 18 MG/3ML SOPN Inject 0.3 mLs (1.8 mg total) into the skin daily. Takes in daily morning 3 mL 3   lisinopril  (ZESTRIL ) 10 MG tablet TAKE 1 TABLET BY MOUTH EVERY DAY 90 tablet 0   LORazepam (ATIVAN) 0.5 MG tablet Take 0.5 mg by mouth every 8 (eight) hours as needed for anxiety. PCP will not provide refills.     Magnesium  400 MG CAPS Take 400 mg by mouth daily. 90 capsule 1   meloxicam  (MOBIC ) 15 MG tablet Take 1 tablet (15 mg total) by mouth daily. After a meal as needed for knee pain 30 tablet 1   Menatetrenone (VITAMIN K2 ) 100 MCG TABS Take 100 mcg by mouth daily. 90 tablet 3   montelukast  (SINGULAIR ) 10 MG tablet Take 1 tablet (10 mg total) by mouth at bedtime. 30 tablet 11   nabumetone  (RELAFEN ) 500 MG tablet Take 1 tablet (500 mg total) by mouth 2 (two) times daily as needed. 60 tablet 3   nicotine  (NICODERM CQ ) 21 mg/24hr patch Place 1 patch (21 mg total) onto the skin  daily. 28 patch 3   nicotine  polacrilex (COMMIT) 4 MG lozenge Take 1 lozenge (4 mg total) by mouth as needed for smoking cessation. 108 tablet 3   omeprazole  (PRILOSEC) 40 MG capsule Take 1 capsule (40 mg total) by mouth daily. 90 capsule 3   predniSONE  (DELTASONE ) 10 MG tablet Take 1 tablet (10 mg total) by mouth daily. 42 tablet 0   QUEtiapine  (SEROQUEL ) 25 MG tablet TAKE 1 TABLET BY MOUTH EVERYDAY AT BEDTIME 30 tablet 0   rosuvastatin  (CRESTOR ) 10 MG tablet TAKE 1 TABLET BY MOUTH DAILY. TO LOWER CHOLESTEROL 90 tablet 2   Tiotropium Bromide Monohydrate  (SPIRIVA   RESPIMAT) 2.5 MCG/ACT AERS Inhale 2 puffs into the lungs daily. 4 g 5   traMADol  (ULTRAM ) 50 MG tablet Take 1 tablet (50 mg total) by mouth every 6 (six) hours as needed. 30 tablet 0   TRESIBA  FLEXTOUCH 100 UNIT/ML SOPN FlexTouch Pen INJECT 20 UNITS INTO THE SKIN DAILY. 15 mL 3   valACYclovir  (VALTREX ) 500 MG tablet TAKE 1 TABLET (500 MG TOTAL) BY MOUTH DAILY. 30 tablet 0   No current facility-administered medications for this visit.      Review of Systems   She denies chest pain, pnd, orthopnea, n, v, dizziness, syncope, edema, weight gain, or early satiety. All other systems reviewed and are otherwise negative except as noted above.   Physical Exam    VS:  BP 130/78   Pulse 77   Ht 5' 2 (1.575 m)   Wt 174 lb (78.9 kg)   BMI 31.83 kg/m  GEN: Well nourished, well developed, in no acute distress. HEENT: normal. Neck: Supple, no JVD, carotid bruits, or masses. Cardiac: RRR, no murmurs, rubs, or gallops. No clubbing, cyanosis, edema.  Radials/DP/PT 2+ and equal bilaterally.  Respiratory:  Respirations regular and unlabored, clear to auscultation bilaterally. GI: Soft, nontender, nondistended, BS + x 4. MS: no deformity or atrophy. Skin: warm and dry, no rash. Neuro:  Strength and sensation are intact. Psych: Normal affect.  Accessory Clinical Findings    ECG personally reviewed by me today - EKG  Interpretation Date/Time:  Friday October 08 2024 14:49:21 EST Ventricular Rate:  76 PR Interval:  152 QRS Duration:  74 QT Interval:  412 QTC Calculation: 463 R Axis:   81  Text Interpretation: Normal sinus rhythm Normal ECG When compared with ECG of 18-Jan-2020 16:06, No significant change was found Confirmed by Daneen Perkins ([id number]) on 10/08/2024 2:50:40 PM  - no acute changes.   Lab Results  Component Value Date   WBC 11.5 (H) 01/18/2020   HGB 15.1 (H) 01/18/2020   HCT 44.7 01/18/2020   MCV 93.5 01/18/2020   PLT 267 01/18/2020   Lab Results  Component Value Date   CREATININE 0.75 07/03/2020   BUN 17 07/03/2020   NA 143 07/03/2020   K 4.8 07/03/2020   CL 109 (H) 07/03/2020   CO2 26 07/03/2020   Lab Results  Component Value Date   ALT 12 07/03/2020   AST 9 07/03/2020   ALKPHOS 70 07/03/2020   BILITOT <0.2 07/03/2020   Lab Results  Component Value Date   CHOL 202 (H) 11/17/2019   HDL 70 11/17/2019   LDLCALC 112 (H) 11/17/2019   TRIG 115 11/17/2019   CHOLHDL 2.9 11/17/2019    Lab Results  Component Value Date   HGBA1C 8.3 (A) 07/03/2020    Assessment & Plan   1. Coronary artery calcification noted on prior CT/epigastric pain/shortness of breath: Coronary artery calcification noted on prior CT.  She reports a several month history of epigastric pain, mild shortness of breath with activity, though shortness of breath is not new.  EKG stable in office today.  Through shared decision making, and for further further risk stratification (she has several risk factors including tobacco use, hypertension, hyperlipidemia, type 2 diabetes), will pursue coronary CT angiogram.  Will update BMET prior to CT scan.  Will also check echocardiogram.  Will arrange for follow-up with cardiologist pending test results.  Of note, she does prefer a female cardiologist.  Reviewed ED precautions.  2. Palpitations: She reports rare fleeting palpitations in the setting  of emotional stress, no  associated symptoms. Continue to monitor for progressive symptoms.  3. Hypertension: BP well controlled. Continue current antihypertensive regimen.   4. Hyperlipidemia:  LDL was 68 in 08/2024.  No longer on cholesterol medication, clear as to why.  Pending coronary CT results, would recommend resumption of lipid-lowering therapy.  5. Type 2 diabetes: Hemoglobin A1c was 8.7 in 05/2024.  Monitored and managed per PCP.  6. COPD/asthma: She reports stable mild shortness of breath with activity.  Followed by pulmonology.  7. Dementia: Pending appointment with local neurologist as she recently [place].   8. Tobacco use: Longstanding history of tobacco use, she quit approximately 2 weeks ago. I congratulated her on this, ongoing cessation advised.  9. Disposition:Follow-up with APP in 6 to 8 weeks.         Damien JAYSON Braver, NP 10/08/2024, 3:04 PM       [1]  Allergies Allergen Reactions   Dust Mite Extract Other (See Comments)   Metformin And Related Diarrhea   Pollen Extract Other (See Comments)   "

## 2024-10-08 NOTE — Patient Instructions (Signed)
 Medication Instructions:  No medication changes today   Lab Work: Your provider wants you to have a bmet, you can go to any [facility] to have this drawn.  We have one located at our [facility] on the 1st floor.  [address] [place], [place] [address]  You may also go to any of these [facility] locations:   [facility] - [address] ([facility]) - [address] [phone] [address]   [place] - [address]    [place]  - [address]    [place] - [address] - [address] [employer]  - [address] [place] - [address] ([facility]     Testing/Procedures: Your physician has requested that you have an echocardiogram. Echocardiography is a painless test that uses sound waves to create images of your heart. It provides your doctor with information about the size and shape of your heart and how well your hearts chambers and valves are working. This procedure takes approximately one hour. There are no restrictions for this procedure. Please do NOT wear cologne, perfume, aftershave, or lotions (deodorant is allowed). Please arrive 15 minutes prior to your appointment time.  Please note: We ask at that you not bring children with you during ultrasound (echo/ vascular) testing. Due to room size and safety concerns, children are not allowed in the ultrasound rooms during exams. Our front office staff cannot provide observation of children in our lobby area while testing is being conducted. An adult accompanying a patient to their appointment will only be allowed in the ultrasound room at the discretion of the ultrasound technician under special circumstances. We apologize for any inconvenience.     Your cardiac CT will be scheduled at one of the below locations:   [facility] [address] [place], [place] [address] [phone] (Severe  contrast allergies only)  OR   [facility] [address] [place], [place] [address] [phone]  OR   [facility] [address] [place], [place] [address] [phone]  OR   [facility] and Vascular Tower [address]  [place], [place] [address]  OR   [facility] [address] [place], [place] [phone]  If scheduled at [facility], please arrive at the [facility] and Children's Entrance (Entrance C2) of [facility] 30 minutes prior to test start time. You can use the FREE valet parking offered at entrance C (encouraged to control the heart rate for the test)  Proceed to the [facility] Radiology Department (first floor) to check-in and test prep.  All radiology patients and guests should use entrance C2 at [facility], accessed from [facility], even though the hospital's physical address listed is [address].  If scheduled at the Heart and Vascular Tower at [address], please enter the parking lot using the [address] entrance and use the FREE valet service at the patient drop-off area. Enter the building and check-in with registration on the main floor.  If scheduled at [facility] please arrive to the Heart and Vascular Center 15 mins early for check-in and test prep.  There is spacious parking and easy access to the radiology department from the [facility] Heart and Vascular entrance. Please enter here and check-in with the desk attendant.  If scheduled at [facility], please arrive 30 minutes early for check-in and test prep.  Please follow these instructions carefully (unless otherwise directed):  An IV will be required for this test and Nitroglycerin will be given.  Hold all erectile dysfunction medications at least 3 days (72 hrs) prior to test. (Ie viagra, cialis, sildenafil, tadalafil, etc)   On the Night Before the  Test: Be sure to Drink plenty of water. Do not consume any caffeinated/decaffeinated beverages or chocolate 12 hours prior to your test. Do not take any antihistamines 12 hours prior to your test.  On the Day of the Test: Drink plenty of water until 1 hour prior to the test. Do not eat any food 1 hour prior to test. You may take your regular medications prior to the test.  Take metoprolol  (Lopressor ) two hours prior to test. If you take Furosemide/Hydrochlorothiazide/Spironolactone/Chlorthalidone, please HOLD on the morning of the test. Patients who wear a continuous glucose monitor MUST remove the device prior to scanning. FEMALES- please wear underwire-free bra if available, avoid dresses & tight clothing  You will be taking Metoprolol  tartrate 100 mg PO 90-120 minutes prior to scan, it has been sent to your pharmacy      After the Test: Drink plenty of water. After receiving IV contrast, you may experience a mild flushed feeling. This is normal. On occasion, you may experience a mild rash up to 24 hours after the test. This is not dangerous. If this occurs, you can take Benadryl 25 mg, Zyrtec , Claritin, or Allegra and increase your fluid intake. (Patients taking Tikosyn should avoid Benadryl, and may take Zyrtec , Claritin, or Allegra) If you experience trouble breathing, this can be serious. If it is severe call 911 IMMEDIATELY. If it is mild, please call our office.  We will call to schedule your test 2-4 weeks out understanding that some insurance companies will need an authorization prior to the service being performed.   For more information and frequently asked questions, please visit our website : [email or web address]  For non-scheduling related questions, please contact the cardiac imaging nurse navigator should you have any questions/concerns: Cardiac Imaging Nurse Navigators Direct Office Dial: [phone]   For scheduling needs, including cancellations and  rescheduling, please call Brittany, [phone].   Follow-Up: At [facility], you and your health needs are our priority.  As part of our continuing mission to provide you with exceptional heart care, our providers are all part of one team.  This team includes your primary Cardiologist (physician) and Advanced Practice Providers or APPs (Physician Assistants and Nurse Practitioners) who all work together to provide you with the care you need, when you need it.  Your next appointment:   6-8 weeks  Provider:   Damien Braver, NP  We recommend signing up for the patient portal called MyChart.  Sign up information is provided on this After Visit Summary.  MyChart is used to connect with patients for Virtual Visits (Telemedicine).  Patients are able to view lab/test results, encounter notes, upcoming appointments, etc.  Non-urgent messages can be sent to your provider as well.   To learn more about what you can do with MyChart, go to [email or web address].

## 2024-10-11 ENCOUNTER — Encounter: Payer: Self-pay | Admitting: Nurse Practitioner

## 2024-10-14 ENCOUNTER — Other Ambulatory Visit: Payer: Self-pay | Admitting: Nurse Practitioner

## 2024-10-14 DIAGNOSIS — F0390 Unspecified dementia without behavioral disturbance: Secondary | ICD-10-CM

## 2024-10-14 DIAGNOSIS — J449 Chronic obstructive pulmonary disease, unspecified: Secondary | ICD-10-CM

## 2024-10-14 DIAGNOSIS — I2510 Atherosclerotic heart disease of native coronary artery without angina pectoris: Secondary | ICD-10-CM

## 2024-10-14 DIAGNOSIS — E785 Hyperlipidemia, unspecified: Secondary | ICD-10-CM

## 2024-10-14 DIAGNOSIS — R1013 Epigastric pain: Secondary | ICD-10-CM

## 2024-10-14 DIAGNOSIS — I10 Essential (primary) hypertension: Secondary | ICD-10-CM

## 2024-10-14 DIAGNOSIS — Z87891 Personal history of nicotine dependence: Secondary | ICD-10-CM

## 2024-10-14 DIAGNOSIS — R002 Palpitations: Secondary | ICD-10-CM

## 2024-10-14 DIAGNOSIS — E118 Type 2 diabetes mellitus with unspecified complications: Secondary | ICD-10-CM

## 2024-10-14 DIAGNOSIS — R0609 Other forms of dyspnea: Secondary | ICD-10-CM

## 2024-11-02 ENCOUNTER — Ambulatory Visit (HOSPITAL_BASED_OUTPATIENT_CLINIC_OR_DEPARTMENT_OTHER)

## 2024-11-23 ENCOUNTER — Ambulatory Visit: Admitting: Nurse Practitioner
